# Patient Record
Sex: Female | Born: 1955
Health system: Southern US, Community
[De-identification: ages and names within clinical notes are randomized; demographics above are authoritative.]

## PROBLEM LIST (undated history)

## (undated) DIAGNOSIS — E785 Hyperlipidemia, unspecified: Secondary | ICD-10-CM

## (undated) DIAGNOSIS — Z9109 Other allergy status, other than to drugs and biological substances: Secondary | ICD-10-CM

## (undated) DIAGNOSIS — Z923 Personal history of irradiation: Secondary | ICD-10-CM

## (undated) DIAGNOSIS — M1990 Unspecified osteoarthritis, unspecified site: Secondary | ICD-10-CM

## (undated) DIAGNOSIS — E669 Obesity, unspecified: Secondary | ICD-10-CM

## (undated) DIAGNOSIS — M4316 Spondylolisthesis, lumbar region: Secondary | ICD-10-CM

## (undated) DIAGNOSIS — I499 Cardiac arrhythmia, unspecified: Secondary | ICD-10-CM

## (undated) DIAGNOSIS — R002 Palpitations: Secondary | ICD-10-CM

## (undated) DIAGNOSIS — Z87891 Personal history of nicotine dependence: Secondary | ICD-10-CM

## (undated) DIAGNOSIS — Z853 Personal history of malignant neoplasm of breast: Secondary | ICD-10-CM

## (undated) DIAGNOSIS — T7840XA Allergy, unspecified, initial encounter: Secondary | ICD-10-CM

## (undated) DIAGNOSIS — K2970 Gastritis, unspecified, without bleeding: Secondary | ICD-10-CM

## (undated) DIAGNOSIS — F419 Anxiety disorder, unspecified: Secondary | ICD-10-CM

## (undated) DIAGNOSIS — I10 Essential (primary) hypertension: Secondary | ICD-10-CM

## (undated) HISTORY — PX: KNEE ARTHROSCOPY: SUR90

## (undated) HISTORY — PX: COLONOSCOPY: SHX174

## (undated) HISTORY — DX: Hyperlipidemia, unspecified: E78.5

## (undated) HISTORY — DX: Personal history of nicotine dependence: Z87.891

## (undated) HISTORY — DX: Allergy, unspecified, initial encounter: T78.40XA

## (undated) HISTORY — DX: Spondylolisthesis, lumbar region: M43.16

## (undated) HISTORY — DX: Obesity, unspecified: E66.9

## (undated) HISTORY — DX: Palpitations: R00.2

## (undated) HISTORY — DX: Unspecified osteoarthritis, unspecified site: M19.90

## (undated) HISTORY — DX: Personal history of malignant neoplasm of breast: Z85.3

## (undated) HISTORY — DX: Other allergy status, other than to drugs and biological substances: Z91.09

---

## 2017-12-30 DIAGNOSIS — F32A Depression, unspecified: Secondary | ICD-10-CM | POA: Insufficient documentation

## 2017-12-30 DIAGNOSIS — I10 Essential (primary) hypertension: Secondary | ICD-10-CM | POA: Insufficient documentation

## 2017-12-30 DIAGNOSIS — M1990 Unspecified osteoarthritis, unspecified site: Secondary | ICD-10-CM | POA: Insufficient documentation

## 2019-08-13 DIAGNOSIS — R590 Localized enlarged lymph nodes: Secondary | ICD-10-CM | POA: Diagnosis not present

## 2019-09-18 DIAGNOSIS — I10 Essential (primary) hypertension: Secondary | ICD-10-CM | POA: Diagnosis not present

## 2019-09-18 DIAGNOSIS — F064 Anxiety disorder due to known physiological condition: Secondary | ICD-10-CM | POA: Diagnosis not present

## 2019-09-18 DIAGNOSIS — Z20828 Contact with and (suspected) exposure to other viral communicable diseases: Secondary | ICD-10-CM | POA: Diagnosis not present

## 2019-09-18 DIAGNOSIS — J9801 Acute bronchospasm: Secondary | ICD-10-CM | POA: Diagnosis not present

## 2019-09-18 DIAGNOSIS — E782 Mixed hyperlipidemia: Secondary | ICD-10-CM | POA: Diagnosis not present

## 2019-10-02 ENCOUNTER — Ambulatory Visit
Admission: RE | Admit: 2019-10-02 | Discharge: 2019-10-02 | Disposition: A | Discharge status: Home / Self Care | Payer: BLUE CROSS/BLUE SHIELD | Attending: Family Medicine | Admitting: Family Medicine

## 2019-10-02 ENCOUNTER — Other Ambulatory Visit: Payer: Self-pay | Admitting: Family Medicine

## 2019-10-02 ENCOUNTER — Ambulatory Visit
Admission: RE | Admit: 2019-10-02 | Discharge: 2019-10-02 | Disposition: A | Discharge status: Home / Self Care | Payer: BLUE CROSS/BLUE SHIELD | Source: Ambulatory Visit | Attending: Family Medicine | Admitting: Family Medicine

## 2019-10-02 ENCOUNTER — Other Ambulatory Visit: Payer: Self-pay

## 2019-10-02 DIAGNOSIS — R0602 Shortness of breath: Secondary | ICD-10-CM | POA: Diagnosis not present

## 2019-10-02 DIAGNOSIS — R059 Cough, unspecified: Secondary | ICD-10-CM

## 2019-10-02 DIAGNOSIS — R05 Cough: Secondary | ICD-10-CM

## 2019-10-17 DIAGNOSIS — J9801 Acute bronchospasm: Secondary | ICD-10-CM | POA: Diagnosis not present

## 2019-10-17 DIAGNOSIS — M130 Polyarthritis, unspecified: Secondary | ICD-10-CM | POA: Diagnosis not present

## 2019-10-17 DIAGNOSIS — I10 Essential (primary) hypertension: Secondary | ICD-10-CM | POA: Diagnosis not present

## 2019-10-17 DIAGNOSIS — E782 Mixed hyperlipidemia: Secondary | ICD-10-CM | POA: Diagnosis not present

## 2019-11-13 DIAGNOSIS — M797 Fibromyalgia: Secondary | ICD-10-CM | POA: Diagnosis not present

## 2019-11-13 DIAGNOSIS — I10 Essential (primary) hypertension: Secondary | ICD-10-CM | POA: Diagnosis not present

## 2019-11-13 DIAGNOSIS — M1990 Unspecified osteoarthritis, unspecified site: Secondary | ICD-10-CM | POA: Diagnosis not present

## 2019-11-13 DIAGNOSIS — M25569 Pain in unspecified knee: Secondary | ICD-10-CM | POA: Diagnosis not present

## 2019-11-16 DIAGNOSIS — I10 Essential (primary) hypertension: Secondary | ICD-10-CM | POA: Diagnosis not present

## 2019-12-18 DIAGNOSIS — E782 Mixed hyperlipidemia: Secondary | ICD-10-CM | POA: Diagnosis not present

## 2019-12-18 DIAGNOSIS — F064 Anxiety disorder due to known physiological condition: Secondary | ICD-10-CM | POA: Diagnosis not present

## 2019-12-18 DIAGNOSIS — I10 Essential (primary) hypertension: Secondary | ICD-10-CM | POA: Diagnosis not present

## 2019-12-18 DIAGNOSIS — M130 Polyarthritis, unspecified: Secondary | ICD-10-CM | POA: Diagnosis not present

## 2020-01-15 DIAGNOSIS — I10 Essential (primary) hypertension: Secondary | ICD-10-CM | POA: Diagnosis not present

## 2020-01-15 DIAGNOSIS — M130 Polyarthritis, unspecified: Secondary | ICD-10-CM | POA: Diagnosis not present

## 2020-01-15 DIAGNOSIS — E782 Mixed hyperlipidemia: Secondary | ICD-10-CM | POA: Diagnosis not present

## 2020-01-15 DIAGNOSIS — F064 Anxiety disorder due to known physiological condition: Secondary | ICD-10-CM | POA: Diagnosis not present

## 2020-01-31 DIAGNOSIS — M545 Low back pain: Secondary | ICD-10-CM | POA: Diagnosis not present

## 2020-01-31 DIAGNOSIS — M25561 Pain in right knee: Secondary | ICD-10-CM | POA: Diagnosis not present

## 2020-01-31 DIAGNOSIS — M25562 Pain in left knee: Secondary | ICD-10-CM | POA: Diagnosis not present

## 2020-02-01 DIAGNOSIS — I10 Essential (primary) hypertension: Secondary | ICD-10-CM | POA: Diagnosis not present

## 2020-03-05 DIAGNOSIS — E782 Mixed hyperlipidemia: Secondary | ICD-10-CM | POA: Diagnosis not present

## 2020-03-05 DIAGNOSIS — F064 Anxiety disorder due to known physiological condition: Secondary | ICD-10-CM | POA: Diagnosis not present

## 2020-03-05 DIAGNOSIS — I10 Essential (primary) hypertension: Secondary | ICD-10-CM | POA: Diagnosis not present

## 2020-03-05 DIAGNOSIS — F332 Major depressive disorder, recurrent severe without psychotic features: Secondary | ICD-10-CM | POA: Diagnosis not present

## 2020-03-25 DIAGNOSIS — M25562 Pain in left knee: Secondary | ICD-10-CM | POA: Diagnosis not present

## 2020-03-25 DIAGNOSIS — M25561 Pain in right knee: Secondary | ICD-10-CM | POA: Diagnosis not present

## 2020-03-25 DIAGNOSIS — M545 Low back pain: Secondary | ICD-10-CM | POA: Diagnosis not present

## 2020-04-04 DIAGNOSIS — H43812 Vitreous degeneration, left eye: Secondary | ICD-10-CM | POA: Diagnosis not present

## 2020-04-04 DIAGNOSIS — H2513 Age-related nuclear cataract, bilateral: Secondary | ICD-10-CM | POA: Diagnosis not present

## 2020-04-09 DIAGNOSIS — E782 Mixed hyperlipidemia: Secondary | ICD-10-CM | POA: Diagnosis not present

## 2020-04-09 DIAGNOSIS — I10 Essential (primary) hypertension: Secondary | ICD-10-CM | POA: Diagnosis not present

## 2020-04-09 DIAGNOSIS — M1030 Gout due to renal impairment, unspecified site: Secondary | ICD-10-CM | POA: Diagnosis not present

## 2020-04-09 DIAGNOSIS — F332 Major depressive disorder, recurrent severe without psychotic features: Secondary | ICD-10-CM | POA: Diagnosis not present

## 2020-04-24 DIAGNOSIS — M170 Bilateral primary osteoarthritis of knee: Secondary | ICD-10-CM | POA: Diagnosis not present

## 2020-05-03 DIAGNOSIS — M170 Bilateral primary osteoarthritis of knee: Secondary | ICD-10-CM | POA: Diagnosis not present

## 2020-05-20 DIAGNOSIS — M170 Bilateral primary osteoarthritis of knee: Secondary | ICD-10-CM | POA: Diagnosis not present

## 2020-06-10 DIAGNOSIS — M130 Polyarthritis, unspecified: Secondary | ICD-10-CM | POA: Diagnosis not present

## 2020-06-10 DIAGNOSIS — I10 Essential (primary) hypertension: Secondary | ICD-10-CM | POA: Diagnosis not present

## 2020-06-10 DIAGNOSIS — F064 Anxiety disorder due to known physiological condition: Secondary | ICD-10-CM | POA: Diagnosis not present

## 2020-06-10 DIAGNOSIS — E782 Mixed hyperlipidemia: Secondary | ICD-10-CM | POA: Diagnosis not present

## 2020-06-21 DIAGNOSIS — M25551 Pain in right hip: Secondary | ICD-10-CM | POA: Diagnosis not present

## 2020-06-21 DIAGNOSIS — M25552 Pain in left hip: Secondary | ICD-10-CM | POA: Diagnosis not present

## 2020-07-08 DIAGNOSIS — M1612 Unilateral primary osteoarthritis, left hip: Secondary | ICD-10-CM | POA: Diagnosis not present

## 2021-04-16 ENCOUNTER — Other Ambulatory Visit: Payer: Self-pay | Admitting: Family Medicine

## 2021-04-16 DIAGNOSIS — Z1231 Encounter for screening mammogram for malignant neoplasm of breast: Secondary | ICD-10-CM

## 2021-05-29 IMAGING — CR DG CHEST 2V
1 series · 2 of 2 positions shown · non-contrast
Comparison: None.

CLINICAL DATA: Cough, wheezing, shortness of breath, negative COVID
test

EXAM:
CHEST - 2 VIEW

[Series 1: dg chest 2 view · 0.14mm/px · 2 of 2 slices shown]
[im 1/2]
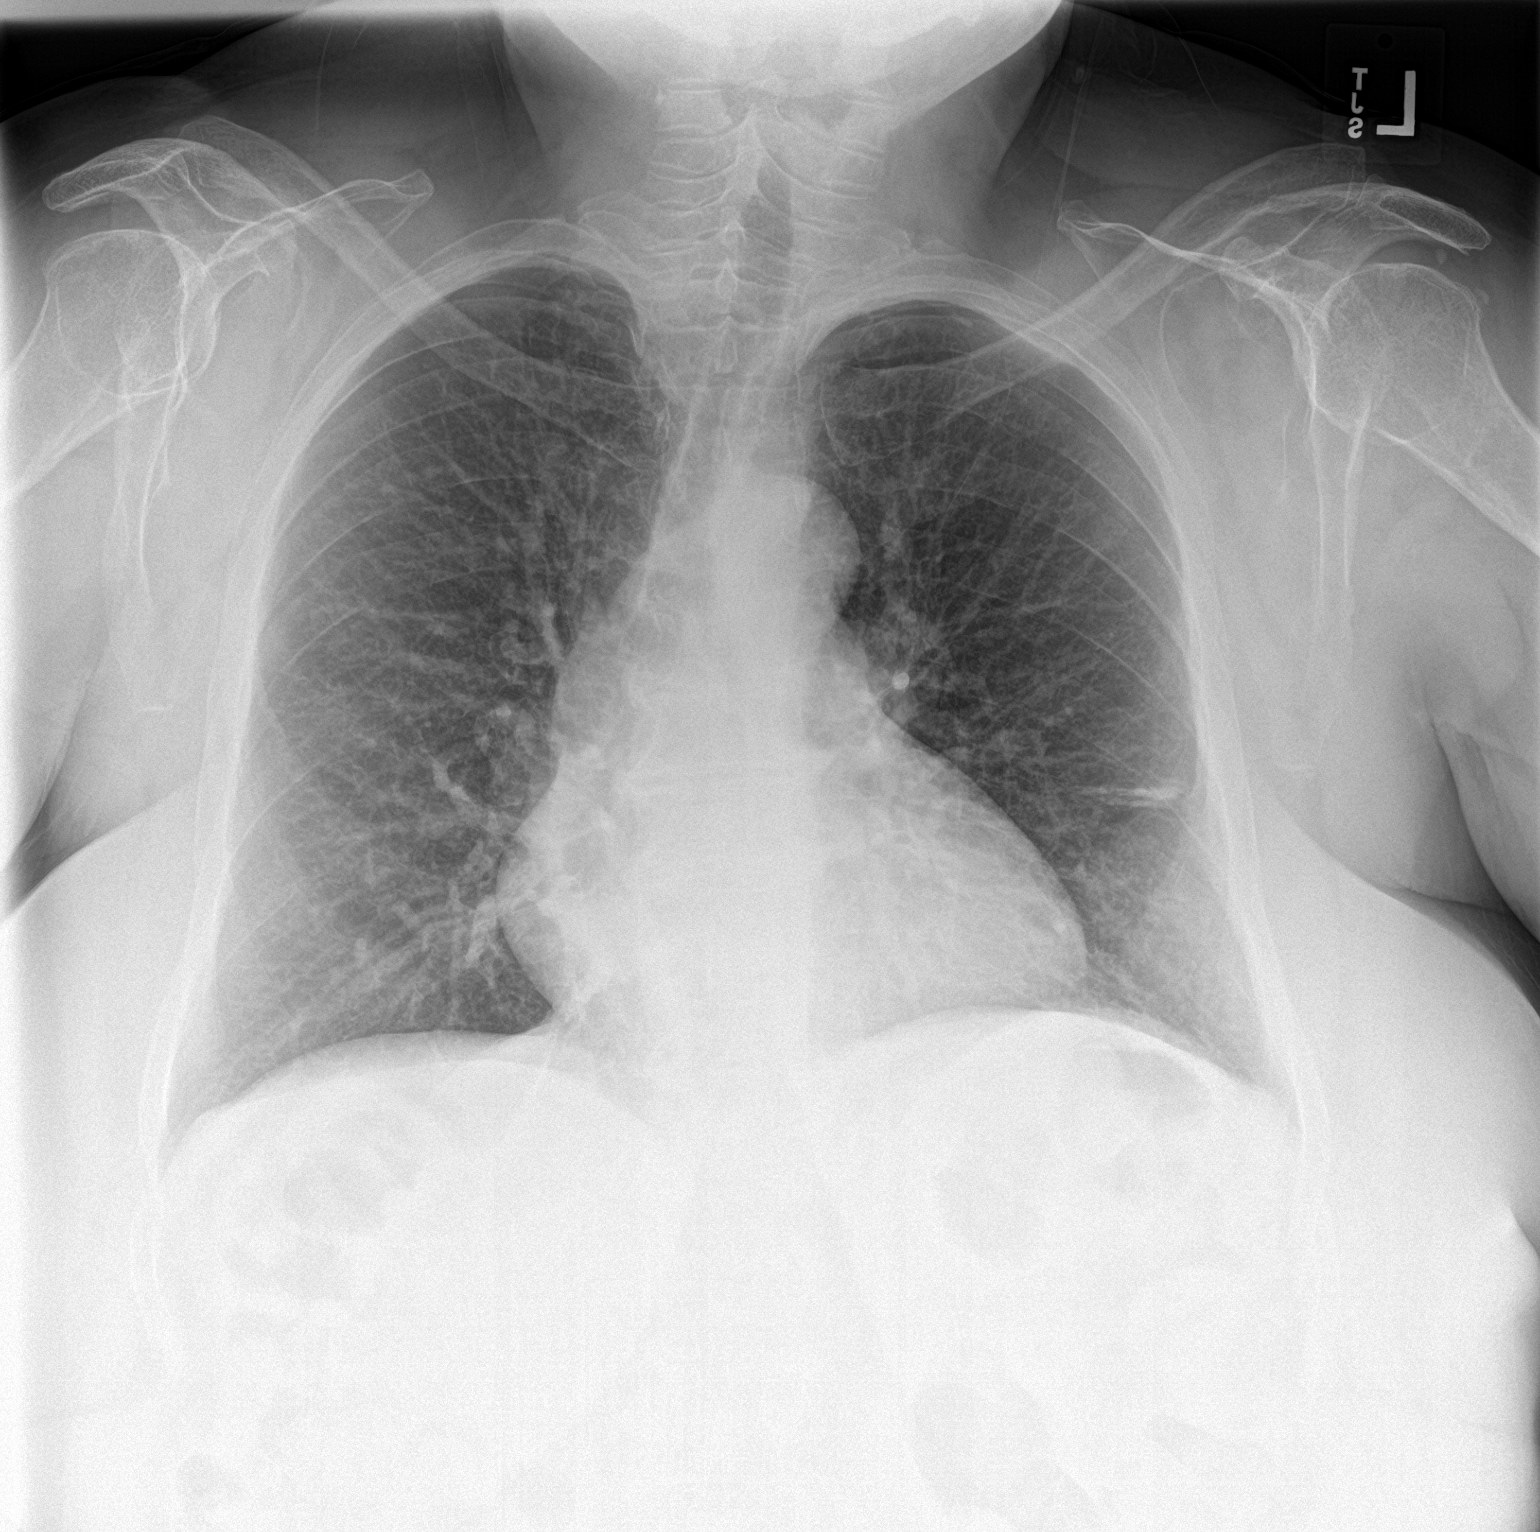
[im 2/2]
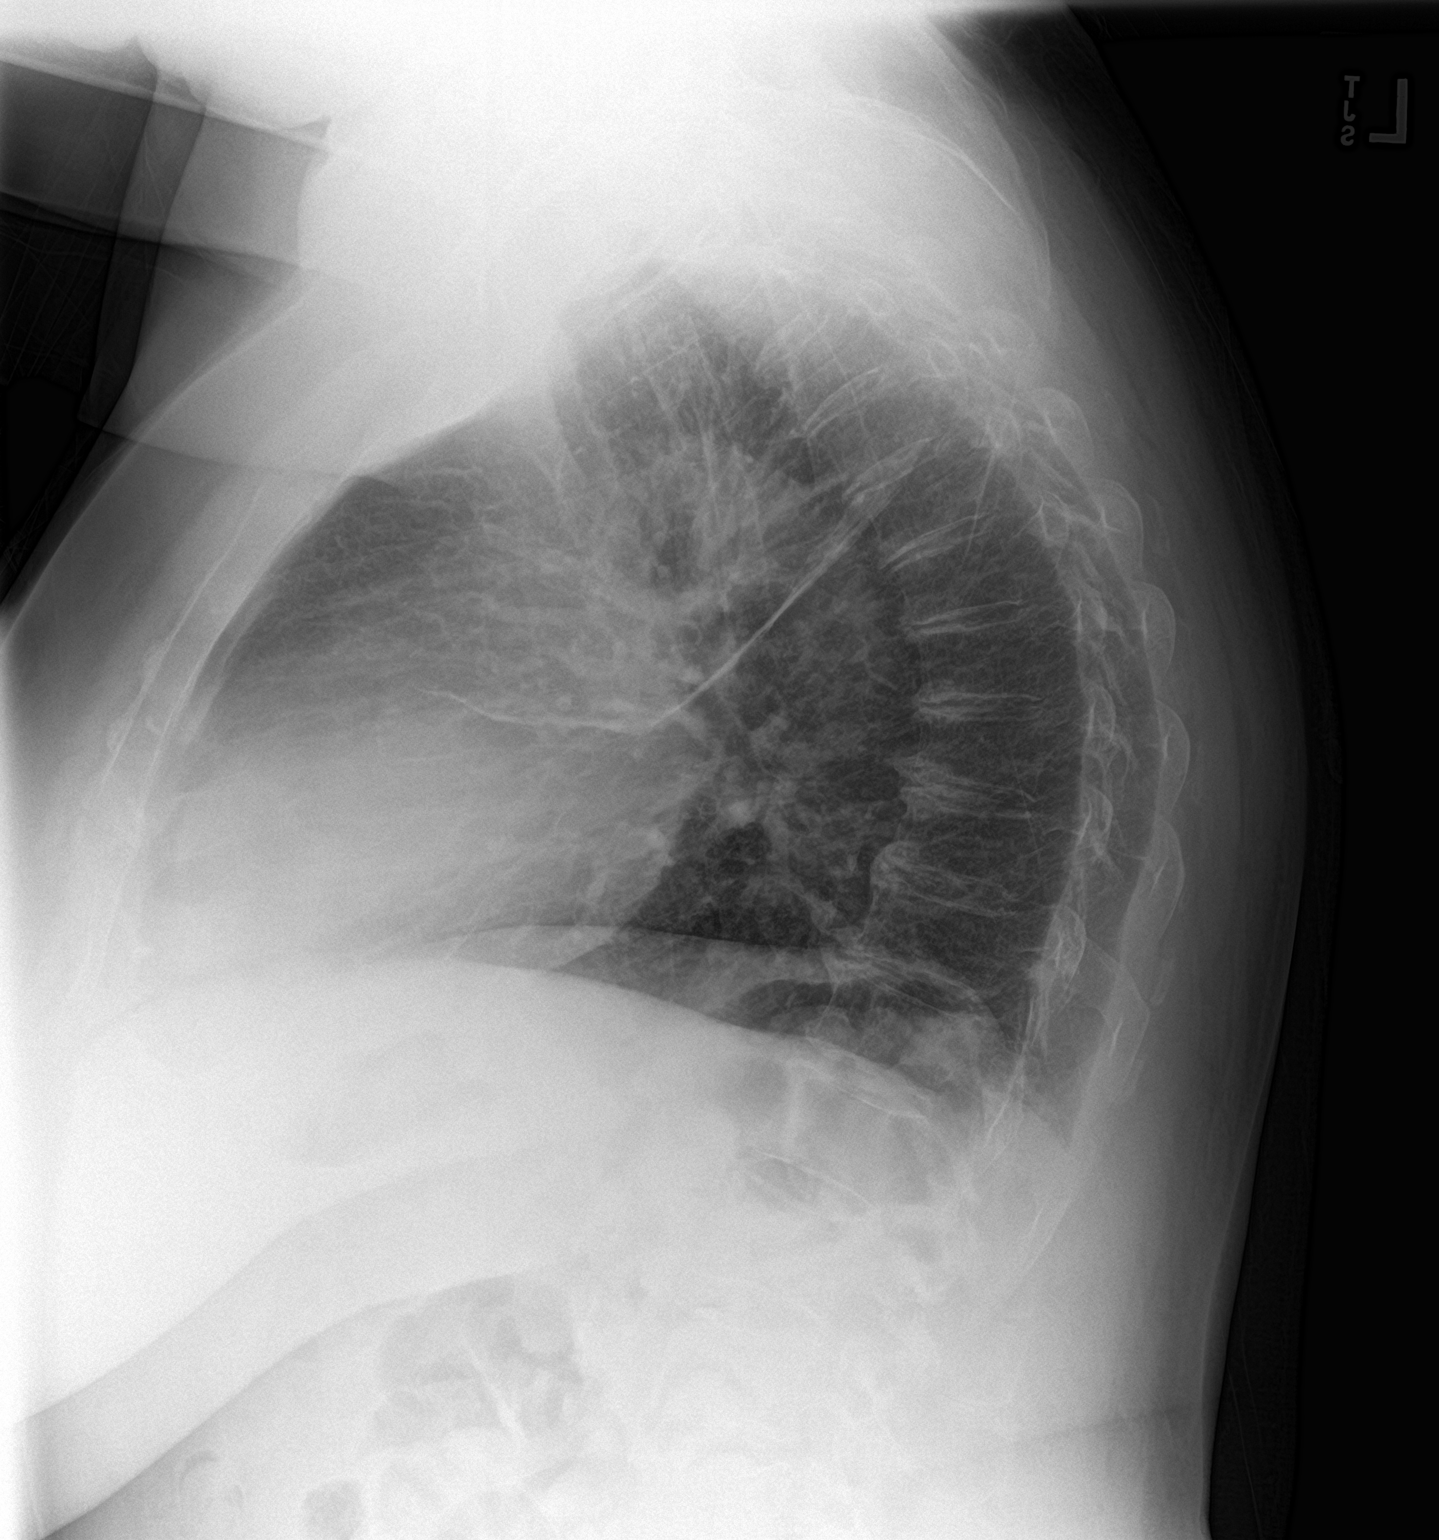

[2 of 2 positions shown; findings below may reference images not displayed]

FINDINGS: Mild cardiomegaly. Pulmonary vascular prominence and mild, diffuse
interstitial pulmonary opacity. Bandlike scarring or atelectasis of
the left midlung. Disc degenerative disease of the thoracic spine.
IMPRESSION: Mild cardiomegaly with pulmonary vascular prominence and mild,
diffuse interstitial pulmonary opacity, findings consistent with
mild pulmonary edema. There is no focal acute airspace opacity.

## 2021-06-03 ENCOUNTER — Ambulatory Visit: Payer: BLUE CROSS/BLUE SHIELD

## 2021-06-17 ENCOUNTER — Other Ambulatory Visit (HOSPITAL_COMMUNITY): Payer: Self-pay | Admitting: Family Medicine

## 2021-06-17 DIAGNOSIS — I739 Peripheral vascular disease, unspecified: Secondary | ICD-10-CM

## 2021-06-20 ENCOUNTER — Ambulatory Visit (HOSPITAL_COMMUNITY)
Admission: RE | Admit: 2021-06-20 | Discharge: 2021-06-20 | Disposition: A | Discharge status: Home / Self Care | Payer: Medicare HMO | Source: Ambulatory Visit | Attending: Family Medicine | Admitting: Family Medicine

## 2021-06-20 ENCOUNTER — Other Ambulatory Visit: Payer: Self-pay

## 2021-06-20 DIAGNOSIS — I739 Peripheral vascular disease, unspecified: Secondary | ICD-10-CM

## 2021-06-20 NOTE — Progress Notes (Signed)
ABI has been completed.   Preliminary results in CV Proc.   Kaitlyn Ramsey 06/20/2021 9:07 AM

## 2021-06-27 ENCOUNTER — Ambulatory Visit: Payer: Self-pay

## 2021-07-30 ENCOUNTER — Ambulatory Visit: Payer: Self-pay

## 2021-08-01 ENCOUNTER — Ambulatory Visit: Payer: Self-pay

## 2021-09-19 ENCOUNTER — Ambulatory Visit
Admission: RE | Admit: 2021-09-19 | Discharge: 2021-09-19 | Disposition: A | Discharge status: Home / Self Care | Payer: Medicare HMO | Source: Ambulatory Visit | Attending: Family Medicine | Admitting: Family Medicine

## 2021-09-19 DIAGNOSIS — Z1231 Encounter for screening mammogram for malignant neoplasm of breast: Secondary | ICD-10-CM

## 2022-11-11 ENCOUNTER — Other Ambulatory Visit: Payer: Self-pay | Admitting: Family Medicine

## 2022-11-11 DIAGNOSIS — Z1231 Encounter for screening mammogram for malignant neoplasm of breast: Secondary | ICD-10-CM

## 2022-12-30 ENCOUNTER — Ambulatory Visit
Admission: RE | Admit: 2022-12-30 | Discharge: 2022-12-30 | Disposition: A | Discharge status: Home / Self Care | Payer: Medicare HMO | Source: Ambulatory Visit | Attending: Family Medicine | Admitting: Family Medicine

## 2022-12-30 DIAGNOSIS — Z1231 Encounter for screening mammogram for malignant neoplasm of breast: Secondary | ICD-10-CM

## 2023-01-01 ENCOUNTER — Other Ambulatory Visit: Payer: Self-pay | Admitting: Family Medicine

## 2023-01-01 DIAGNOSIS — R928 Other abnormal and inconclusive findings on diagnostic imaging of breast: Secondary | ICD-10-CM

## 2023-01-09 ENCOUNTER — Other Ambulatory Visit: Payer: Self-pay | Admitting: Family Medicine

## 2023-01-09 ENCOUNTER — Ambulatory Visit
Admission: RE | Admit: 2023-01-09 | Discharge: 2023-01-09 | Disposition: A | Discharge status: Home / Self Care | Payer: Medicare HMO | Source: Ambulatory Visit | Attending: Family Medicine | Admitting: Family Medicine

## 2023-01-09 DIAGNOSIS — N6320 Unspecified lump in the left breast, unspecified quadrant: Secondary | ICD-10-CM

## 2023-01-09 DIAGNOSIS — R599 Enlarged lymph nodes, unspecified: Secondary | ICD-10-CM

## 2023-01-09 DIAGNOSIS — R928 Other abnormal and inconclusive findings on diagnostic imaging of breast: Secondary | ICD-10-CM

## 2023-01-15 ENCOUNTER — Ambulatory Visit
Admission: RE | Admit: 2023-01-15 | Discharge: 2023-01-15 | Disposition: A | Discharge status: Home / Self Care | Payer: Medicare HMO | Source: Ambulatory Visit | Attending: Family Medicine | Admitting: Family Medicine

## 2023-01-15 DIAGNOSIS — R599 Enlarged lymph nodes, unspecified: Secondary | ICD-10-CM

## 2023-01-15 DIAGNOSIS — N6320 Unspecified lump in the left breast, unspecified quadrant: Secondary | ICD-10-CM

## 2023-01-15 DIAGNOSIS — R928 Other abnormal and inconclusive findings on diagnostic imaging of breast: Secondary | ICD-10-CM

## 2023-01-15 HISTORY — PX: BREAST BIOPSY: SHX20

## 2023-01-19 ENCOUNTER — Telehealth: Payer: Self-pay | Admitting: Hematology and Oncology

## 2023-01-19 NOTE — Telephone Encounter (Signed)
Spoke to patient to confirm upcoming morning [facility] clinic appointment  on 5/29, paperwork will be sent via mail.   Gave location and time, also informed patient that the surgeon's office would be calling as well to get information from them similar to the packet that they will be receiving so make sure to do both.  Reminded patient that all providers will be coming to the clinic to see them HERE and if they had any questions to not hesitate to reach back out to myself or their navigators. 

## 2023-01-19 NOTE — Telephone Encounter (Signed)
Patient called to see if she could get some meds for anxiety or to help her sleep, she has been beside herself since getting the news that she has cancer, I told her I would ask the team, but technically she hasn't been seen by our providers just yet. I told her to come to her appointment get the information it may calm her down and if not then she could ask the provider then

## 2023-01-26 ENCOUNTER — Encounter: Payer: Self-pay | Admitting: *Deleted

## 2023-01-26 ENCOUNTER — Other Ambulatory Visit: Payer: Self-pay | Admitting: *Deleted

## 2023-01-26 DIAGNOSIS — D0512 Intraductal carcinoma in situ of left breast: Secondary | ICD-10-CM | POA: Insufficient documentation

## 2023-01-27 ENCOUNTER — Encounter: Payer: Self-pay | Admitting: *Deleted

## 2023-01-27 ENCOUNTER — Inpatient Hospital Stay: Payer: Medicare HMO | Admitting: Licensed Clinical Social Worker

## 2023-01-27 ENCOUNTER — Inpatient Hospital Stay: Payer: Medicare HMO | Attending: Hematology and Oncology | Admitting: Hematology and Oncology

## 2023-01-27 ENCOUNTER — Ambulatory Visit
Admission: RE | Admit: 2023-01-27 | Discharge: 2023-01-27 | Disposition: A | Discharge status: Home / Self Care | Payer: Medicare HMO | Source: Ambulatory Visit | Attending: Radiation Oncology | Admitting: Radiation Oncology

## 2023-01-27 ENCOUNTER — Ambulatory Visit: Payer: Medicare HMO | Admitting: Physical Therapy

## 2023-01-27 ENCOUNTER — Telehealth: Payer: Self-pay | Admitting: Genetic Counselor

## 2023-01-27 ENCOUNTER — Inpatient Hospital Stay: Payer: Medicare HMO

## 2023-01-27 ENCOUNTER — Ambulatory Visit: Payer: Self-pay | Admitting: Surgery

## 2023-01-27 VITALS — BP 138/59 | HR 76 | Temp 98.7°F | Resp 18 | Wt 207.6 lb

## 2023-01-27 DIAGNOSIS — D0512 Intraductal carcinoma in situ of left breast: Secondary | ICD-10-CM | POA: Diagnosis present

## 2023-01-27 DIAGNOSIS — Z170 Estrogen receptor positive status [ER+]: Secondary | ICD-10-CM

## 2023-01-27 LAB — GENETIC SCREENING ORDER

## 2023-01-27 LAB — CMP (CANCER CENTER ONLY)
ALT: 18 U/L (ref 0–44)
AST: 22 U/L (ref 15–41)
Albumin: 4.6 g/dL (ref 3.5–5.0)
Alkaline Phosphatase: 75 U/L (ref 38–126)
Anion gap: 6 (ref 5–15)
BUN: 10 mg/dL (ref 8–23)
CO2: 32 mmol/L (ref 22–32)
Calcium: 9.9 mg/dL (ref 8.9–10.3)
Chloride: 100 mmol/L (ref 98–111)
Creatinine: 0.74 mg/dL (ref 0.44–1.00)
GFR, Estimated: >60 mL/min (ref >60)
Glucose, Bld: 102 mg/dL — ABNORMAL HIGH (ref 70–99)
Potassium: 3.8 mmol/L (ref 3.5–5.1)
Sodium: 138 mmol/L (ref 135–145)
Total Bilirubin: 1 mg/dL (ref 0.3–1.2)
Total Protein: 8.1 g/dL (ref 6.5–8.1)

## 2023-01-27 LAB — CBC WITH DIFFERENTIAL (CANCER CENTER ONLY)
Abs Immature Granulocytes: 0.01 10*3/uL (ref 0.00–0.07)
Basophils Absolute: 0.1 10*3/uL (ref 0.0–0.1)
Basophils Relative: 1 %
Eosinophils Absolute: 0.1 10*3/uL (ref 0.0–0.5)
Eosinophils Relative: 2 %
HCT: 39.5 % (ref 36.0–46.0)
Hemoglobin: 14.5 g/dL (ref 12.0–15.0)
Immature Granulocytes: 0 %
Lymphocytes Relative: 34 %
Lymphs Abs: 2.1 10*3/uL (ref 0.7–4.0)
MCH: 29.9 pg (ref 26.0–34.0)
MCHC: 36.7 g/dL — ABNORMAL HIGH (ref 30.0–36.0)
MCV: 81.4 fL (ref 80.0–100.0)
Monocytes Absolute: 0.5 10*3/uL (ref 0.1–1.0)
Monocytes Relative: 7 %
Neutro Abs: 3.5 10*3/uL (ref 1.7–7.7)
Neutrophils Relative %: 56 %
Platelet Count: 278 10*3/uL (ref 150–400)
RBC: 4.85 MIL/uL (ref 3.87–5.11)
RDW: 12.5 % (ref 11.5–15.5)
WBC Count: 6.3 10*3/uL (ref 4.0–10.5)
nRBC: 0 % (ref 0.0–0.2)

## 2023-01-27 MED ORDER — ALPRAZOLAM 0.5 MG PO TABS: 0.5000 mg | ORAL_TABLET | Freq: Every day | ORAL | 0 refills | Status: DC | PRN

## 2023-01-27 NOTE — Assessment & Plan Note (Signed)
This is a very pleasant 67 yr old female patient with new diagnosis of left breast DCIS, ER PR positive referred to breast MDC for recommendations. We have discussed the following findings about DCIS.  Pathology review: I discussed with the patient the difference between DCIS and invasive breast cancer. It is considered a precancerous lesion. DCIS is classified as a Stage 0 breast cancer. It is generally detected through mammograms as calcifications. We discussed the significance of grades and its impact on prognosis. We also discussed the importance of ER and PR receptors and their implications to adjuvant treatment options. Prognosis of DCIS dependence on grade and degree of comedo necrosis. It is anticipated that if not treated, 20-30% of DCIS can develop into invasive breast cancer.  Recommendation: 1. Breast conserving surgery 2. Followed by adjuvant radiation therapy 3. Followed by antiestrogen therapy with tamoxifen/aromatase inhibitors based on menopausal status 5 years  Tamoxifen counseling: We discussed the risks and benefits of tamoxifen. These include but not limited to insomnia, hot flashes, mood changes, vaginal dryness, and weight gain. Although rare, serious side effects including endometrial cancer, risk of blood clots were also discussed. We strongly believe that the benefits far outweigh the risks. Patient understands these risks and consented to starting treatment. Planned treatment duration is 5 years.  Aromatase inhibitors counseling: We have discussed the mechanism of action of aromatase inhibitors today.  We have discussed adverse effects including but not limited to menopausal symptoms, increased risk of osteoporosis and fractures, cardiovascular events, arthralgias and myalgias.  We do believe that the benefits far outweigh the risks.  Plan treatment duration of 5 years.

## 2023-01-27 NOTE — H&P (Signed)
Subjective   Chief Complaint: Breast Cancer   Breast [facility]DC 01/27/23 [place]/ [place]  History of Present Illness: Kaitlyn Ramsey is a 67 y.o. female who is seen today as an office consultation at the request of Dr. Al Pimple for evaluation of Breast Cancer .    This is a 67 year old female who recently underwent screening mammogram.  This shows some asymmetry in the left breast.  She underwent diagnostic mammogram and ultrasound.  This showed a 1.0 x 1.0 x 0.4 cm longitudinal mass located in the left breast at 2:00, 2 cm from the nipple.  She underwent biopsy of this area that revealed ductal carcinoma in situ grade 2, ER/PR positive.  The patient has no family history of breast cancer.  She presents today for evaluation.  She is accompanied by her daughter who is a Armed forces training and education officer for [employer] at [employer]. Review of Systems: A complete review of systems was obtained from the patient.  I have reviewed this information and discussed as appropriate with the patient.  See HPI as well for other ROS.  Review of Systems  Constitutional: Negative.   HENT: Negative.    Eyes: Negative.   Respiratory: Negative.    Cardiovascular: Negative.   Gastrointestinal: Negative.   Genitourinary: Negative.   Musculoskeletal:  Positive for back pain, joint pain and myalgias.  Skin: Negative.   Neurological: Negative.   Endo/Heme/Allergies: Negative.   Psychiatric/Behavioral: Negative.        Medical History: Past Medical History:  Diagnosis Date   Anxiety    Arthritis    Hyperlipidemia    Hypertension     Patient Active Problem List  Diagnosis   Ductal carcinoma in situ (DCIS) of left breast   Otalgia, left ear   Sensorineural hearing loss (SNHL) of both ears   Tinnitus of both ears   Herpes zoster of neck   Palpitations   Heartburn   Nervously anxious    Past Surgical History:  Procedure Laterality Date   .Knee surgery  N/A 2009     Allergies  Allergen Reactions    Penicillins Other (See Comments) and Rash    Current Outpatient Medications on File Prior to Visit  Medication Sig Dispense Refill   amLODIPine (NORVASC) 10 MG tablet Take 10 mg by mouth once daily     atorvastatin (LIPITOR) 20 MG tablet Take 20 mg by mouth once daily     citalopram (CELEXA) 10 MG tablet Take 10-20 mg by mouth as directed TAKE 1 TABLET BY MOUTH IN THE MORNING FOR 10 DAYS, THEN TAKE 2 TABLETS BY MOUTH EVERY AM     gabapentin (NEURONTIN) 600 MG tablet Take 600 mg by mouth 3 (three) times daily     metoprolol tartrate (LOPRESSOR) 25 MG tablet Take 25 mg by mouth 2 (two) times daily     oxyCODONE-acetaminophen (PERCOCET) 10-325 mg tablet Take 1 tablet by mouth every 6 (six) hours as needed for Pain     OZEMPIC 2 mg/dose (8 mg/3 mL) pen injector Inject 2 mg subcutaneously once a week     traMADoL (ULTRAM) 100 mg tablet Take 100 mg by mouth every 8 (eight) hours as needed     valsartan-hydroCHLOROthiazide (DIOVAN-HCT) 160-25 mg tablet Take 1 tablet by mouth every morning     No current facility-administered medications on file prior to visit.    Family History  Family history unknown: Yes     Social History   Tobacco Use  Smoking Status Former   Types: Cigarettes  Smokeless Tobacco Never     Social History   Socioeconomic History   Marital status: Single  Tobacco Use   Smoking status: Former    Types: Cigarettes   Smokeless tobacco: Never  Vaping Use   Vaping status: Never Used  Substance and Sexual Activity   Alcohol use: Never   Drug use: Never   Social Determinants of Health    Received from [employer]   Social Network    Objective:     Physical Exam   Constitutional:  WDWN in NAD, conversant, no obvious deformities; lying in bed comfortably Eyes:  Pupils equal, round; sclera anicteric; moist conjunctiva; no lid lag HENT:  Oral mucosa moist; good dentition  Neck:  No masses palpated, trachea midline; no thyromegaly Lungs:  CTA bilaterally;  normal respiratory effort Breasts:  symmetric, no nipple changes; no palpable masses or lymphadenopathy on either side CV:  Regular rate and rhythm; no murmurs; extremities well-perfused with no edema Abd:  +bowel sounds, soft, non-tender, no palpable organomegaly; no palpable hernias Musc: Normal gait; no apparent clubbing or cyanosis in extremities Lymphatic:  No palpable cervical or axillary lymphadenopathy Skin:  Warm, dry; no sign of jaundice Psychiatric - alert and oriented x 4; calm mood and affect   Labs, Imaging and Diagnostic Testing:  Diagnosis 1. Breast, left, needle core biopsy, 2 o'clock, 2 cmfn, venus clip DUCTAL CARCINOMA IN SITU, INTERMEDIATE NUCLEAR GRADE, CRIBRIFORM TYPE WITHOUT NECROSIS INVOLVING A FRAGMENTED PAPILLARY LESION NEGATIVE FOR INVASIVE CARCINOMA NEGATIVE FOR MICROCALCIFICATIONS 2. Lymph node, needle/core biopsy, left axillary, hydromark spiral clip COMPATIBLE WITH A BENIGN REACTIVE LYMPH NODE  PROGNOSTIC INDICATORS Results: IMMUNOHISTOCHEMICAL AND MORPHOMETRIC ANALYSIS PERFORMED MANUALLY Estrogen Receptor: 100%, POSITIVE, STRONG STAINING INTENSITY Progesterone Receptor: 95%, POSITIVE, STRONG STAINING INTENSITY REFERENCE RANGE ESTROGEN RECEPTOR NEGATIVE 0% POSITIVE =>1% REFERENCE RANGE PROGESTERONE RECEPTOR NEGATIVE 0% POSITIVE =>1% All controls stained appropriately Marlena Clipper MD Pathologist, Electronic Signature ( Signed 01/19/2023)  CLINICAL DATA:  Screening recall for possible left breast asymmetry.   EXAM: DIGITAL DIAGNOSTIC UNILATERAL LEFT MAMMOGRAM WITH TOMOSYNTHESIS; ULTRASOUND LEFT BREAST LIMITED   TECHNIQUE: Left digital diagnostic mammography and breast tomosynthesis was performed.; Targeted ultrasound examination of the left breast was performed.   COMPARISON:  Previous exam(s).   ACR Breast Density Category b: There are scattered areas of fibroglandular density.   FINDINGS: Additional tomograms were performed of the  left breast. There is oval mass in the periareolar/slightly upper outer left breast measuring 0.9 cm. Dilated ducts are also present in the periareolar left breast.   Targeted ultrasound of the left breast was performed demonstrating a mass with the margin irregularity in the left breast at 2 o'clock 2 cm from nipple measuring 1 x 0.4 x 1 cm. This is associated with an adjacent duct and may represent an intraductal. This is felt to correspond well with the mass seen in the left breast at mammography. Several small lymph nodes are identified in the left axilla with a 1.2 cm lymph node with questionable/borderline cortical thickening of 0.4 cm.   IMPRESSION: 1. Suspicious 1 cm mass in the left breast at 2 o'clock 2 cm from nipple.   2. Lymph node with borderline cortical thickening in the left axilla.   RECOMMENDATION: 1. Recommend ultrasound-guided core biopsy of the mass in the left breast at 2 o'clock 2 cm from nipple.   2. Recommend ultrasound-guided core biopsy of the lymph node in the left axilla.   I have discussed the findings and recommendations with the patient. If applicable,  a reminder letter will be sent to the patient regarding the next appointment.   BI-RADS CATEGORY  4: Suspicious.     Electronically Signed   By: Edwin Cap M.D.   On: 01/09/2023 10:14    Assessment and Plan:  Diagnoses and all orders for this visit:  Ductal carcinoma in situ (DCIS) of left breast    I had a discussion with the patient and her daughter.  I also conferred with the other members of the treatment team.  We discussed her surgical options which include mastectomy versus breast conservation.  The patient has a relatively small area of ductal carcinoma in situ.  She is a good candidate for breast conservation.  We recommend left breast radioactive seed localized lumpectomy. The surgical procedure has been discussed with the patient.  Potential risks, benefits, alternative  treatments, and expected outcomes have been explained.  All of the patient's questions at this time have been answered.  The likelihood of reaching the patient's treatment goal is good.  The patient understand the proposed surgical procedure and wishes to proceed.  Surgery will likely be followed by radiation and antiestrogens.    Johnell Bas Delbert Harness, MD  01/27/2023 2:40 PM

## 2023-01-27 NOTE — H&P (View-Only) (Signed)
 Subjective   Chief Complaint: Breast Cancer   Breast [facility] 01/27/23 [facility]/ [place]  History of Present Illness: Kaitlyn Ramsey is a 67 y.o. female who is seen today as an office consultation at the request of Dr. Iruku for evaluation of Breast Cancer .    This is a 67-year-old female who recently underwent screening mammogram.  This shows some asymmetry in the left breast.  She underwent diagnostic mammogram and ultrasound.  This showed a 1.0 x 1.0 x 0.4 cm longitudinal mass located in the left breast at 2:00, 2 cm from the nipple.  She underwent biopsy of this area that revealed ductal carcinoma in situ grade 2, ER/PR positive.  The patient has no family history of breast cancer.  She presents today for evaluation.  She is accompanied by her daughter who is a referral coordinator for [place] at [place]. Review of Systems: A complete review of systems was obtained from the patient.  I have reviewed this information and discussed as appropriate with the patient.  See HPI as well for other ROS.  Review of Systems  Constitutional: Negative.   HENT: Negative.    Eyes: Negative.   Respiratory: Negative.    Cardiovascular: Negative.   Gastrointestinal: Negative.   Genitourinary: Negative.   Musculoskeletal:  Positive for back pain, joint pain and myalgias.  Skin: Negative.   Neurological: Negative.   Endo/Heme/Allergies: Negative.   Psychiatric/Behavioral: Negative.        Medical History: Past Medical History:  Diagnosis Date   Anxiety    Arthritis    Hyperlipidemia    Hypertension     Patient Active Problem List  Diagnosis   Ductal carcinoma in situ (DCIS) of left breast   Otalgia, left ear   Sensorineural hearing loss (SNHL) of both ears   Tinnitus of both ears   Herpes zoster of neck   Palpitations   Heartburn   Nervously anxious    Past Surgical History:  Procedure Laterality Date   .Knee surgery  N/A 2009     Allergies  Allergen Reactions    Penicillins Other (See Comments) and Rash    Current Outpatient Medications on File Prior to Visit  Medication Sig Dispense Refill   amLODIPine (NORVASC) 10 MG tablet Take 10 mg by mouth once daily     atorvastatin (LIPITOR) 20 MG tablet Take 20 mg by mouth once daily     citalopram (CELEXA) 10 MG tablet Take 10-20 mg by mouth as directed TAKE 1 TABLET BY MOUTH IN THE MORNING FOR 10 DAYS, THEN TAKE 2 TABLETS BY MOUTH EVERY AM     gabapentin (NEURONTIN) 600 MG tablet Take 600 mg by mouth 3 (three) times daily     metoprolol tartrate (LOPRESSOR) 25 MG tablet Take 25 mg by mouth 2 (two) times daily     oxyCODONE-acetaminophen (PERCOCET) 10-325 mg tablet Take 1 tablet by mouth every 6 (six) hours as needed for Pain     OZEMPIC 2 mg/dose (8 mg/3 mL) pen injector Inject 2 mg subcutaneously once a week     traMADoL (ULTRAM) 100 mg tablet Take 100 mg by mouth every 8 (eight) hours as needed     valsartan-hydroCHLOROthiazide (DIOVAN-HCT) 160-25 mg tablet Take 1 tablet by mouth every morning     No current facility-administered medications on file prior to visit.    Family History  Family history unknown: Yes     Social History   Tobacco Use  Smoking Status Former   Types: Cigarettes    Smokeless Tobacco Never     Social History   Socioeconomic History   Marital status: Single  Tobacco Use   Smoking status: Former    Types: Cigarettes   Smokeless tobacco: Never  Vaping Use   Vaping status: Never Used  Substance and Sexual Activity   Alcohol use: Never   Drug use: Never   Social Determinants of Health    Received from [facility]   Social Network    Objective:     Physical Exam   Constitutional:  WDWN in NAD, conversant, no obvious deformities; lying in bed comfortably Eyes:  Pupils equal, round; sclera anicteric; moist conjunctiva; no lid lag HENT:  Oral mucosa moist; good dentition  Neck:  No masses palpated, trachea midline; no thyromegaly Lungs:  CTA bilaterally;  normal respiratory effort Breasts:  symmetric, no nipple changes; no palpable masses or lymphadenopathy on either side CV:  Regular rate and rhythm; no murmurs; extremities well-perfused with no edema Abd:  +bowel sounds, soft, non-tender, no palpable organomegaly; no palpable hernias Musc: Normal gait; no apparent clubbing or cyanosis in extremities Lymphatic:  No palpable cervical or axillary lymphadenopathy Skin:  Warm, dry; no sign of jaundice Psychiatric - alert and oriented x 4; calm mood and affect   Labs, Imaging and Diagnostic Testing:  Diagnosis 1. Breast, left, needle core biopsy, 2 o'clock, 2 cmfn, venus clip DUCTAL CARCINOMA IN SITU, INTERMEDIATE NUCLEAR GRADE, CRIBRIFORM TYPE WITHOUT NECROSIS INVOLVING A FRAGMENTED PAPILLARY LESION NEGATIVE FOR INVASIVE CARCINOMA NEGATIVE FOR MICROCALCIFICATIONS 2. Lymph node, needle/core biopsy, left axillary, hydromark spiral clip COMPATIBLE WITH A BENIGN REACTIVE LYMPH NODE  PROGNOSTIC INDICATORS Results: IMMUNOHISTOCHEMICAL AND MORPHOMETRIC ANALYSIS PERFORMED MANUALLY Estrogen Receptor: 100%, POSITIVE, STRONG STAINING INTENSITY Progesterone Receptor: 95%, POSITIVE, STRONG STAINING INTENSITY REFERENCE RANGE ESTROGEN RECEPTOR NEGATIVE 0% POSITIVE =>1% REFERENCE RANGE PROGESTERONE RECEPTOR NEGATIVE 0% POSITIVE =>1% All controls stained appropriately Zhaoli Lane MD Pathologist, Electronic Signature ( Signed 01/19/2023)  CLINICAL DATA:  Screening recall for possible left breast asymmetry.   EXAM: DIGITAL DIAGNOSTIC UNILATERAL LEFT MAMMOGRAM WITH TOMOSYNTHESIS; ULTRASOUND LEFT BREAST LIMITED   TECHNIQUE: Left digital diagnostic mammography and breast tomosynthesis was performed.; Targeted ultrasound examination of the left breast was performed.   COMPARISON:  Previous exam(s).   ACR Breast Density Category b: There are scattered areas of fibroglandular density.   FINDINGS: Additional tomograms were performed of the  left breast. There is oval mass in the periareolar/slightly upper outer left breast measuring 0.9 cm. Dilated ducts are also present in the periareolar left breast.   Targeted ultrasound of the left breast was performed demonstrating a mass with the margin irregularity in the left breast at 2 o'clock 2 cm from nipple measuring 1 x 0.4 x 1 cm. This is associated with an adjacent duct and may represent an intraductal. This is felt to correspond well with the mass seen in the left breast at mammography. Several small lymph nodes are identified in the left axilla with a 1.2 cm lymph node with questionable/borderline cortical thickening of 0.4 cm.   IMPRESSION: 1. Suspicious 1 cm mass in the left breast at 2 o'clock 2 cm from nipple.   2. Lymph node with borderline cortical thickening in the left axilla.   RECOMMENDATION: 1. Recommend ultrasound-guided core biopsy of the mass in the left breast at 2 o'clock 2 cm from nipple.   2. Recommend ultrasound-guided core biopsy of the lymph node in the left axilla.   I have discussed the findings and recommendations with the patient. If applicable,   a reminder letter will be sent to the patient regarding the next appointment.   BI-RADS CATEGORY  4: Suspicious.     Electronically Signed   By: Jennifer  Jarosz M.D.   On: 01/09/2023 10:14    Assessment and Plan:  Diagnoses and all orders for this visit:  Ductal carcinoma in situ (DCIS) of left breast    I had a discussion with the patient and her daughter.  I also conferred with the other members of the treatment team.  We discussed her surgical options which include mastectomy versus breast conservation.  The patient has a relatively small area of ductal carcinoma in situ.  She is a good candidate for breast conservation.  We recommend left breast radioactive seed localized lumpectomy. The surgical procedure has been discussed with the patient.  Potential risks, benefits, alternative  treatments, and expected outcomes have been explained.  All of the patient's questions at this time have been answered.  The likelihood of reaching the patient's treatment goal is good.  The patient understand the proposed surgical procedure and wishes to proceed.  Surgery will likely be followed by radiation and antiestrogens.    Briley Bumgarner KAI Makell Drohan, MD  01/27/2023 2:40 PM 

## 2023-01-27 NOTE — Telephone Encounter (Signed)
Kaitlyn Ramsey was seen by a genetic counselor during the breast multidisciplinary clinic on 01/27/2023. In addition to her personal history of breast cancer, she reported a paternal aunt and paternal cousin diagnosed with cancer (unknown types at unknown ages). She does not meet NCCN criteria for genetic testing at this time. She was still offered genetic counseling and testing but declined. We encourage her to contact us if there are any changes to her personal or family history of cancer. If she meets NCCN criteria based on the updated personal/family history, she would be recommended to have genetic counseling and testing.   Kaitlyn Brothers, MS, [facility] Genetic Counselor [email or web address] (P) [phone]

## 2023-01-27 NOTE — Progress Notes (Signed)
Radiation Oncology         [phone] ________________________________  Multidisciplinary Breast Oncology Clinic [facility]) Initial Outpatient Consultation  Name: Kaitlyn Ramsey MRN: [id number]  Date: 01/27/2023  DOB: [birth year]  WU:[id number], Adrian Saran, MD  Manus Rudd, MD   REFERRING PHYSICIAN: Manus Rudd, MD  DIAGNOSIS: The encounter diagnosis was Ductal carcinoma in situ (DCIS) of left breast.  Stage 0 (cTis (DCIS), cN0, cM0) Left Breast, Intermediate grade DCIS, ER+ / PR+ / Her2 not assessed    ICD-10-CM   1. Ductal carcinoma in situ (DCIS) of left breast  D05.12       HISTORY OF PRESENT ILLNESS::Kaitlyn Ramsey is a 67 y.o. female who is presenting to the office today for evaluation of her newly diagnosed breast cancer. She is accompanied by her daughter. She is doing well overall.   She had routine screening mammography on 12/30/22 showing a possible abnormality in the left breast. She underwent  a left breast diagnostic mammography with tomography and left breast ultrasonography at [facility] on 01/09/23 showing: a suspicious 1 cm mass in the 2 o'clock left breast 2 cmfn and one abnormal left axillary lymph node with borderline cortical thickening.   Biopsy of the 2 o'clock left breast on 01/15/23 showed: intermediate grade DCIS involving a fragmented papillary lesion. Prognostic indicators significant for: estrogen receptor, 100% positive and progesterone receptor, 95% positive, both with strong staining intensity. HER2 not assessed.  Biopsy of the left axillary lymph node showed no evidence of malignancy.   Menarche: 40-34 years old Age at first live birth: 67 years old GP: 1 LMP: menopausal (LMP date unknown) Contraceptive: reports contraceptive use but does not remember any details regarding type or dates HRT: never used   The patient was referred today for presentation in the multidisciplinary conference.  Radiology studies and pathology slides were  presented there for review and discussion of treatment options.  A consensus was discussed regarding potential next steps.  PREVIOUS RADIATION THERAPY: No  PAST MEDICAL HISTORY: No past medical history on file.  PAST SURGICAL HISTORY: Past Surgical History:  Procedure Laterality Date   BREAST BIOPSY Left 01/15/2023   [place] LT BREAST BX W LOC DEV 1ST LESION IMG BX SPEC US GUIDE 01/15/2023 [facility] MAMMOGRAPHY    FAMILY HISTORY:  Family History  Problem Relation Age of Onset   Cancer Paternal Aunt        unknown type   Cancer Cousin        unknown type, paternal first cousin    SOCIAL HISTORY:  Social History   Socioeconomic History   Marital status: Single    Spouse name: Not on file   Number of children: Not on file   Years of education: Not on file   Highest education level: Not on file  Occupational History   Not on file  Tobacco Use   Smoking status: Former    Types: Cigarettes   Smokeless tobacco: Not on file  Substance and Sexual Activity   Alcohol use: Never   Drug use: Never   Sexual activity: Not on file  Other Topics Concern   Not on file  Social History Narrative   Not on file   Social Determinants of Health   Financial Resource Strain: Not on file  Food Insecurity: Not on file  Transportation Needs: Not on file  Physical Activity: Not on file  Stress: Not on file  Social Connections: Not on file    ALLERGIES: Not on File  MEDICATIONS:  Current Outpatient Medications  Medication Sig Dispense Refill   ALPRAZolam (XANAX) 0.5 MG tablet Take 1 tablet (0.5 mg total) by mouth daily as needed for anxiety. 10 tablet 0   No current facility-administered medications for this encounter.    REVIEW OF SYSTEMS: A 10+ POINT REVIEW OF SYSTEMS WAS OBTAINED including neurology, dermatology, psychiatry, cardiac, respiratory, lymph, extremities, GI, GU, musculoskeletal, constitutional, reproductive, HEENT. On the provided form, she reports hip and back pain, wearing  glasses, dental problems, dentures, breast pain, joint pain, arthritis, difficulties walking, and anxiety. She denies any other symptoms.    PHYSICAL EXAM:     01/27/2023  Vitals with BMI   Weight 207 lbs 10 oz   Systolic 138 !   Diastolic 59 !   Pulse 76       Lungs are clear to auscultation bilaterally. Heart has regular rate and rhythm. No palpable cervical, supraclavicular, or axillary adenopathy. Abdomen soft, non-tender, normal bowel sounds. Breast: Right breast with no palpable mass, nipple discharge, or bleeding. Left breast with biopsy site in the 2 o'clock position adjacent to the areolar border. Both breasts are large and pendulous.   KPS = 90  100 - Normal; no complaints; no evidence of disease. 90   - Able to carry on normal activity; minor signs or symptoms of disease. 80   - Normal activity with effort; some signs or symptoms of disease. 3   - Cares for self; unable to carry on normal activity or to do active work. 60   - Requires occasional assistance, but is able to care for most of his personal needs. 50   - Requires considerable assistance and frequent medical care. 40   - Disabled; requires special care and assistance. 30   - Severely disabled; hospital admission is indicated although death not imminent. 20   - Very sick; hospital admission necessary; active supportive treatment necessary. 10   - Moribund; fatal processes progressing rapidly. 0     - Dead  Karnofsky DA, Abelmann WH, Craver LS and Burchenal [facility] [phone]) The use of the nitrogen mustards in the palliative treatment of carcinoma: with particular reference to bronchogenic carcinoma Cancer 1 [id number]  LABORATORY DATA:  Lab Results  Component Value Date   WBC 6.3 01/27/2023   HGB 14.5 01/27/2023   HCT 39.5 01/27/2023   MCV 81.4 01/27/2023   PLT 278 01/27/2023   Lab Results  Component Value Date   NA 138 01/27/2023   K 3.8 01/27/2023   CL 100 01/27/2023   CO2 32 01/27/2023   Lab Results   Component Value Date   ALT 18 01/27/2023   AST 22 01/27/2023   ALKPHOS 75 01/27/2023   BILITOT 1.0 01/27/2023    PULMONARY FUNCTION TEST:   Review Flowsheet        No data to display          RADIOGRAPHY: [place] AXILLARY NODE CORE BIOPSY LEFT  Addendum Date: 01/18/2023   ADDENDUM REPORT: 01/18/2023 12:17 ADDENDUM: PATHOLOGY revealed: Site 1. Breast, LEFT, needle core biopsy, 2 o'clock, 2 cm fn, venus clip DUCTAL CARCINOMA IN SITU, INTERMEDIATE NUCLEAR GRADE, CRIBRIFORM TYPE WITHOUT NECROSIS INVOLVING A FRAGMENTED PAPILLARY LESION NEGATIVE FOR INVASIVE CARCINOMA NEGATIVE FOR MICROCALCIFICATIONS Pathology results are CONCORDANT with imaging findings, per Dr. Frederico Hamman. PATHOLOGY revealed: Site 2. Lymph node, needle/core biopsy, LEFT axillary, hydromark spiral clip COMPATIBLE WITH A BENIGN REACTIVE LYMPH NODE Pathology results are CONCORDANT with imaging findings, per Dr. Frederico Hamman. Pathology results and recommendations below were  discussed with patient by telephone on 01/18/2023. Patient reported biopsy site within normal limits with slight tenderness at the site. Post biopsy care instructions were reviewed, questions were answered and my direct phone number was provided to patient. Patient was instructed to call [facility] if any concerns or questions arise related to the biopsy. The patient was referred to the [facility] at [facility] with appointment on 01/27/2023. Pathology results reported by Lynett Grimes, RN on 01/18/2023. Electronically Signed   By: Frederico Hamman M.D.   On: 01/18/2023 12:17   Result Date: 01/18/2023 CLINICAL DATA:  67 year old female presenting for ultrasound-guided biopsy of a left breast mass and a left axillary lymph node. EXAM: ULTRASOUND GUIDED LEFT BREAST CORE NEEDLE BIOPSY COMPARISON:  Previous exam(s). PROCEDURE: I met with the patient and we discussed the procedure of  ultrasound-guided biopsy, including benefits and alternatives. We discussed the high likelihood of a successful procedure. We discussed the risks of the procedure, including infection, bleeding, tissue injury, clip migration, and inadequate sampling. Informed written consent was given. The usual time-out protocol was performed immediately prior to the procedure. Lesion quadrant: Upper outer quadrant Using sterile technique and 1% Lidocaine as local anesthetic, under direct ultrasound visualization, a 14 gauge spring-loaded device was used to perform biopsy of a left breast mass at 2 o'clock using an inferior approach. At the conclusion of the procedure venus shaped tissue marker clip was deployed into the biopsy cavity. Using sterile technique and 1% Lidocaine as local anesthetic, under direct ultrasound visualization, a 14 gauge spring-loaded device was used to perform biopsy of a left axillary lymph node using an inferior approach. At the conclusion of the procedure a HydroMARK spiral shaped tissue marker clip was deployed into the biopsy cavity. Follow up 2 view mammogram was performed and dictated separately. IMPRESSION: 1. Ultrasound guided biopsy of a left breast mass at 2 o'clock (venus clip). No apparent complications. 2. Ultrasound guided biopsy of a left axillary lymph node (HydroMARK spiral clip). No apparent complications. Electronically Signed: By: Frederico Hamman M.D. On: 01/15/2023 08:51  [place] LT BREAST BX W LOC DEV 1ST LESION IMG BX SPEC US GUIDE  Addendum Date: 01/18/2023   ADDENDUM REPORT: 01/18/2023 12:17 ADDENDUM: PATHOLOGY revealed: Site 1. Breast, LEFT, needle core biopsy, 2 o'clock, 2 cm fn, venus clip DUCTAL CARCINOMA IN SITU, INTERMEDIATE NUCLEAR GRADE, CRIBRIFORM TYPE WITHOUT NECROSIS INVOLVING A FRAGMENTED PAPILLARY LESION NEGATIVE FOR INVASIVE CARCINOMA NEGATIVE FOR MICROCALCIFICATIONS Pathology results are CONCORDANT with imaging findings, per Dr. Frederico Hamman. PATHOLOGY  revealed: Site 2. Lymph node, needle/core biopsy, LEFT axillary, hydromark spiral clip COMPATIBLE WITH A BENIGN REACTIVE LYMPH NODE Pathology results are CONCORDANT with imaging findings, per Dr. Frederico Hamman. Pathology results and recommendations below were discussed with patient by telephone on 01/18/2023. Patient reported biopsy site within normal limits with slight tenderness at the site. Post biopsy care instructions were reviewed, questions were answered and my direct phone number was provided to patient. Patient was instructed to call [facility] if any concerns or questions arise related to the biopsy. The patient was referred to the [facility] at [facility] with appointment on 01/27/2023. Pathology results reported by Lynett Grimes, RN on 01/18/2023. Electronically Signed   By: Frederico Hamman M.D.   On: 01/18/2023 12:17   Result Date: 01/18/2023 CLINICAL DATA:  67 year old female presenting for ultrasound-guided biopsy of a left breast mass and a  left axillary lymph node. EXAM: ULTRASOUND GUIDED LEFT BREAST CORE NEEDLE BIOPSY COMPARISON:  Previous exam(s). PROCEDURE: I met with the patient and we discussed the procedure of ultrasound-guided biopsy, including benefits and alternatives. We discussed the high likelihood of a successful procedure. We discussed the risks of the procedure, including infection, bleeding, tissue injury, clip migration, and inadequate sampling. Informed written consent was given. The usual time-out protocol was performed immediately prior to the procedure. Lesion quadrant: Upper outer quadrant Using sterile technique and 1% Lidocaine as local anesthetic, under direct ultrasound visualization, a 14 gauge spring-loaded device was used to perform biopsy of a left breast mass at 2 o'clock using an inferior approach. At the conclusion of the procedure venus shaped tissue marker clip was deployed into  the biopsy cavity. Using sterile technique and 1% Lidocaine as local anesthetic, under direct ultrasound visualization, a 14 gauge spring-loaded device was used to perform biopsy of a left axillary lymph node using an inferior approach. At the conclusion of the procedure a HydroMARK spiral shaped tissue marker clip was deployed into the biopsy cavity. Follow up 2 view mammogram was performed and dictated separately. IMPRESSION: 1. Ultrasound guided biopsy of a left breast mass at 2 o'clock (venus clip). No apparent complications. 2. Ultrasound guided biopsy of a left axillary lymph node (HydroMARK spiral clip). No apparent complications. Electronically Signed: By: Frederico Hamman M.D. On: 01/15/2023 08:51  MM CLIP PLACEMENT LEFT  Result Date: 01/15/2023 CLINICAL DATA:  Post biopsy mammogram of the left breast for clip placement. EXAM: 3D DIAGNOSTIC LEFT MAMMOGRAM POST ULTRASOUND BIOPSY COMPARISON:  Previous exam(s). FINDINGS: 3D Mammographic images were obtained following ultrasound guided biopsy of a left breast mass and left axillary lymph node. The biopsy marking clip within the breast in expected position at the site of biopsy. The clip placed at the biopsy site in the left axilla cannot be visualized due to its deep positioning. IMPRESSION: 1. Appropriate positioning of the venus shaped biopsy marking clip at the site of biopsy in the upper outer anterior left breast. 2. The clip in the left axilla cannot be visualized due to its deep positioning. Final Assessment: Post Procedure Mammograms for Marker Placement Electronically Signed   By: Frederico Hamman M.D.   On: 01/15/2023 08:56  MM 3D DIAGNOSTIC MAMMOGRAM UNILATERAL LEFT BREAST  Result Date: 01/09/2023 CLINICAL DATA:  Screening recall for possible left breast asymmetry. EXAM: DIGITAL DIAGNOSTIC UNILATERAL LEFT MAMMOGRAM WITH TOMOSYNTHESIS; ULTRASOUND LEFT BREAST LIMITED TECHNIQUE: Left digital diagnostic mammography and breast tomosynthesis was  performed.; Targeted ultrasound examination of the left breast was performed. COMPARISON:  Previous exam(s). ACR Breast Density Category b: There are scattered areas of fibroglandular density. FINDINGS: Additional tomograms were performed of the left breast. There is oval mass in the periareolar/slightly upper outer left breast measuring 0.9 cm. Dilated ducts are also present in the periareolar left breast. Targeted ultrasound of the left breast was performed demonstrating a mass with the margin irregularity in the left breast at 2 o'clock 2 cm from nipple measuring 1 x 0.4 x 1 cm. This is associated with an adjacent duct and may represent an intraductal. This is felt to correspond well with the mass seen in the left breast at mammography. Several small lymph nodes are identified in the left axilla with a 1.2 cm lymph node with questionable/borderline cortical thickening of 0.4 cm. IMPRESSION: 1. Suspicious 1 cm mass in the left breast at 2 o'clock 2 cm from nipple. 2. Lymph node with borderline cortical thickening in  the left axilla. RECOMMENDATION: 1. Recommend ultrasound-guided core biopsy of the mass in the left breast at 2 o'clock 2 cm from nipple. 2. Recommend ultrasound-guided core biopsy of the lymph node in the left axilla. I have discussed the findings and recommendations with the patient. If applicable, a reminder letter will be sent to the patient regarding the next appointment. BI-RADS CATEGORY  4: Suspicious. Electronically Signed   By: Edwin Cap M.D.   On: 01/09/2023 10:14  [place] LIMITED ULTRASOUND INCLUDING AXILLA LEFT BREAST   Result Date: 01/09/2023 CLINICAL DATA:  Screening recall for possible left breast asymmetry. EXAM: DIGITAL DIAGNOSTIC UNILATERAL LEFT MAMMOGRAM WITH TOMOSYNTHESIS; ULTRASOUND LEFT BREAST LIMITED TECHNIQUE: Left digital diagnostic mammography and breast tomosynthesis was performed.; Targeted ultrasound examination of the left breast was performed. COMPARISON:  Previous  exam(s). ACR Breast Density Category b: There are scattered areas of fibroglandular density. FINDINGS: Additional tomograms were performed of the left breast. There is oval mass in the periareolar/slightly upper outer left breast measuring 0.9 cm. Dilated ducts are also present in the periareolar left breast. Targeted ultrasound of the left breast was performed demonstrating a mass with the margin irregularity in the left breast at 2 o'clock 2 cm from nipple measuring 1 x 0.4 x 1 cm. This is associated with an adjacent duct and may represent an intraductal. This is felt to correspond well with the mass seen in the left breast at mammography. Several small lymph nodes are identified in the left axilla with a 1.2 cm lymph node with questionable/borderline cortical thickening of 0.4 cm. IMPRESSION: 1. Suspicious 1 cm mass in the left breast at 2 o'clock 2 cm from nipple. 2. Lymph node with borderline cortical thickening in the left axilla. RECOMMENDATION: 1. Recommend ultrasound-guided core biopsy of the mass in the left breast at 2 o'clock 2 cm from nipple. 2. Recommend ultrasound-guided core biopsy of the lymph node in the left axilla. I have discussed the findings and recommendations with the patient. If applicable, a reminder letter will be sent to the patient regarding the next appointment. BI-RADS CATEGORY  4: Suspicious. Electronically Signed   By: Edwin Cap M.D.   On: 01/09/2023 10:14  MM 3D SCREENING MAMMOGRAM BILATERAL BREAST  Result Date: 12/31/2022 CLINICAL DATA:  Screening. EXAM: DIGITAL SCREENING BILATERAL MAMMOGRAM WITH TOMOSYNTHESIS AND CAD TECHNIQUE: Bilateral screening digital craniocaudal and mediolateral oblique mammograms were obtained. Bilateral screening digital breast tomosynthesis was performed. The images were evaluated with computer-aided detection. COMPARISON:  Previous exam(s). ACR Breast Density Category a: The breasts are almost entirely fatty. FINDINGS: In the left breast, a  possible asymmetry warrants further evaluation. In the right breast, no findings suspicious for malignancy. IMPRESSION: Further evaluation is suggested for possible asymmetry in the left breast. RECOMMENDATION: Diagnostic mammogram and possibly ultrasound of the left breast. (Code:[id number]) The patient will be contacted regarding the findings, and additional imaging will be scheduled. BI-RADS CATEGORY  0: Incomplete: Need additional imaging evaluation. Electronically Signed   By: Sande Brothers M.D.   On: 12/31/2022 10:12      IMPRESSION: Stage 0 (cTis (DCIS), cN0, cM0) Left Breast, Intermediate grade DCIS, ER+ / PR+ / Her2 not assessed   Patient will be a good candidate for breast conservation with radiotherapy to the left breast. We discussed the general course of radiation, potential side effects, and toxicities with radiation and the patient is interested in this approach.   PLAN:  Genetics  Left lumpectomy  Adjuvant radiation therapy  Aromatase inhibitor    ------------------------------------------------  Billie Lade, PhD, MD  This document serves as a record of services personally performed by Antony Blackbird, MD. It was created on his behalf by Neena Rhymes, a trained medical scribe. The creation of this record is based on the scribe's personal observations and the provider's statements to them. This document has been checked and approved by the attending provider.

## 2023-01-27 NOTE — Progress Notes (Signed)
[facility] Clinical Social Work  Initial Assessment   Kaitlyn Ramsey is a 67 y.o. year old female accompanied by daughter, Marijo Conception. Clinical Social Work was referred by  [facility]  for assessment of psychosocial needs.   SDOH (Social Determinants of Health) assessments performed: Yes SDOH Interventions    Flowsheet Row Clinical Support from 01/27/2023 in [facility] at [facility]  SDOH Interventions   Food Insecurity Interventions Intervention Not Indicated  Housing Interventions Intervention Not Indicated  Transportation Interventions Intervention Not Indicated  Utilities Interventions Intervention Not Indicated       SDOH Screenings   Food Insecurity: No Food Insecurity (01/27/2023)  Housing: Low Risk  (01/27/2023)  Transportation Needs: No Transportation Needs (01/27/2023)  Utilities: Not At Risk (01/27/2023)  Tobacco Use: Medium Risk (01/27/2023)     Distress Screen completed: Yes    01/27/2023    1:38 PM  ONCBCN DISTRESS SCREENING  Screening Type Initial Screening  Practical problem type Transportation  Emotional problem type Nervousness/Anxiety  Physical Problem type Pain;Sleep/insomnia      Family/Social Information:  Housing Arrangement: patient lives with her daughter. Has 4 grandkids ages 65-27 and 5 great grandkids ages a few months-6 years Family members/support persons in your life? Family Transportation concerns: yes, potentially with cost of gas  Employment: Retired.  Income source: Actor concerns:  Yes, potentially with cost of gas during daily radiation Type of concern: Transportation Food access concerns: no Religious or spiritual practice: [place] is an important coping mechanism in patient's life Services Currently in place:  [employer] [facility]  Coping/ Adjustment to diagnosis: Patient understands treatment plan and what happens next? yes, feeling better after receiving more information from the medical  team Concerns about diagnosis and/or treatment:  cost of transportation for daily treatment Patient reported stressors: Transportation, nervousness Patient enjoys watching TV and time with family/ friends Current coping skills/ strengths: Manufacturing systems engineer , Motivation for treatment/growth , Religious Affiliation , and Supportive family/friends     SUMMARY: Current SDOH Barriers:  Transportation- cost concerns with daily treatment  Clinical Social Work Clinical Goal(s):  Patient will follow up with Armed forces training and education officer when starting radiation* as directed by SW  Interventions: Discussed common feeling and emotions when being diagnosed with cancer, and the importance of support during treatment Informed patient of the support team roles and support services at [facility] Provided CSW contact information and encouraged patient to call with any questions or concerns Provided patient with information about Licensed conveyancer and potential assistance while undergoing radiation treatment   Follow Up Plan: Patient will contact CSW with any support or resource needs Patient verbalizes understanding of plan: Yes    Rawson Minix E Alonna Bartling, LCSW Clinical Social Worker [facility]

## 2023-01-27 NOTE — Progress Notes (Signed)
[facility] CONSULT NOTE  Patient Care Team: Renaye Rakers, MD as PCP - General (Family Medicine) Rachel Moulds, MD as Consulting Physician (Hematology and Oncology) Antony Blackbird, MD as Consulting Physician (Radiation Oncology) Manus Rudd, MD as Consulting Physician (General Surgery) Pershing Proud, RN as Oncology Nurse Navigator Donnelly Angelica, RN as Oncology Nurse Navigator  CHIEF COMPLAINTS/PURPOSE OF CONSULTATION:  Newly diagnosed breast cancer  HISTORY OF PRESENTING ILLNESS:  Kaitlyn Ramsey 67 y.o. female is here because of recent diagnosis of left breast DCIS  I reviewed her records extensively and collaborated the history with the patient.  SUMMARY OF ONCOLOGIC HISTORY: Oncology History  Ductal carcinoma in situ (DCIS) of left breast  12/30/2022 Mammogram   Bilateral screening mammogram showed a possible asymmetry in the left breast.  Diagnostic mammogram showed suspicious 1 cm mass in the left breast at 2:00 2 cm from the nipple, lymph node with borderline cortical thickening in the left axilla   01/15/2023 Pathology Results   Left breast needle core biopsy at 2:00 showed DCIS, intermediate nuclear grade, cribriform type with necrosis, negative for invasive carcinoma.  Lymph node was benign and reactive.  Prognostic showed ER positive at 100% strong staining PR 95% positive strong staining   01/26/2023 Initial Diagnosis   Ductal carcinoma in situ (DCIS) of left breast    This is a very pleasant 67 year old female patient with newly diagnosed left breast DCIS ER/PR positive referred to breast [facility] for additional recommendations.  She arrived to the appointment today with her daughter.  She denies any breast changes prior to the mammogram.  She is on Ozempic for weight loss, her hemoglobin A1c is at 6 and is well-controlled.  She has some arthritis related pain.  Rest of the pertinent 10 point ROS reviewed and negative  MEDICAL HISTORY:  No past medical  history on file.  SURGICAL HISTORY: Past Surgical History:  Procedure Laterality Date   BREAST BIOPSY Left 01/15/2023   [place] LT BREAST BX W LOC DEV 1ST LESION IMG BX SPEC US GUIDE 01/15/2023 [facility] MAMMOGRAPHY    SOCIAL HISTORY: Social History   Socioeconomic History   Marital status: Single    Spouse name: Not on file   Number of children: Not on file   Years of education: Not on file   Highest education level: Not on file  Occupational History   Not on file  Tobacco Use   Smoking status: Former    Types: Cigarettes   Smokeless tobacco: Not on file  Substance and Sexual Activity   Alcohol use: Never   Drug use: Never   Sexual activity: Not on file  Other Topics Concern   Not on file  Social History Narrative   Not on file   Social Determinants of Health   Financial Resource Strain: Not on file  Food Insecurity: Not on file  Transportation Needs: Not on file  Physical Activity: Not on file  Stress: Not on file  Social Connections: Not on file  Intimate Partner Violence: Not on file    FAMILY HISTORY: No family history on file.  ALLERGIES:  has no allergies on file.  MEDICATIONS:  Current Outpatient Medications  Medication Sig Dispense Refill   ALPRAZolam (XANAX) 0.5 MG tablet Take 0.5 mg by mouth daily as needed for anxiety.     No current facility-administered medications for this visit.  Medication reconciliation to be updated  PHYSICAL EXAMINATION: ECOG PERFORMANCE STATUS: 0 - Asymptomatic  Vitals:   01/27/23 0913  BP: (!) 138/59  Pulse: 76  Resp: 18  Temp: 98.7 F (37.1 C)  SpO2: 99%   Filed Weights   01/27/23 0913  Weight: 207 lb 9.6 oz (94.2 kg)    GENERAL:alert, no distress and comfortable Breast: Bilateral breasts inspected and palpated.  No palpable breast  LABORATORY DATA:  I have reviewed the data as listed Lab Results  Component Value Date   WBC 6.3 01/27/2023   HGB 14.5 01/27/2023   HCT 39.5 01/27/2023   MCV 81.4 01/27/2023    PLT 278 01/27/2023   Lab Results  Component Value Date   NA 138 01/27/2023   K 3.8 01/27/2023   CL 100 01/27/2023   CO2 32 01/27/2023    RADIOGRAPHIC STUDIES: I have personally reviewed the radiological reports and agreed with the findings in the report.  ASSESSMENT AND PLAN:  Ductal carcinoma in situ (DCIS) of left breast This is a very pleasant 67 yr old female patient with new diagnosis of left breast DCIS, ER PR positive referred to breast [facility] for recommendations. We have discussed the following findings about DCIS.  Pathology review: I discussed with the patient the difference between DCIS and invasive breast cancer. It is considered a precancerous lesion. DCIS is classified as a Stage 0 breast cancer. It is generally detected through mammograms as calcifications. We discussed the significance of grades and its impact on prognosis. We also discussed the importance of ER and PR receptors and their implications to adjuvant treatment options. Prognosis of DCIS dependence on grade and degree of comedo necrosis. It is anticipated that if not treated, 20-30% of DCIS can develop into invasive breast cancer.  Recommendation: 1. Breast conserving surgery 2. Followed by adjuvant radiation therapy 3. Followed by antiestrogen therapy with tamoxifen/aromatase inhibitors based on menopausal status 5 years  Tamoxifen counseling: We discussed the risks and benefits of tamoxifen. These include but not limited to insomnia, hot flashes, mood changes, vaginal dryness, and weight gain. Although rare, serious side effects including endometrial cancer, risk of blood clots were also discussed. We strongly believe that the benefits far outweigh the risks. Patient understands these risks and consented to starting treatment. Planned treatment duration is 5 years.  Aromatase inhibitors counseling: We have discussed the mechanism of action of aromatase inhibitors today.  We have discussed adverse effects  including but not limited to menopausal symptoms, increased risk of osteoporosis and fractures, cardiovascular events, arthralgias and myalgias.  We do believe that the benefits far outweigh the risks.  Plan treatment duration of 5 years.  She will return to clinic after completion of radiation to initiate antiestrogen therapy.  She has some situational anxiety and request a short prescription of Xanax.  She understands that we cannot refill this medication long term.  All questions were answered. The patient knows to call the clinic with any problems, questions or concerns.    Rachel Moulds, MD 01/27/23

## 2023-01-29 ENCOUNTER — Other Ambulatory Visit: Payer: Self-pay | Admitting: Surgery

## 2023-01-29 DIAGNOSIS — D0512 Intraductal carcinoma in situ of left breast: Secondary | ICD-10-CM

## 2023-02-03 DIAGNOSIS — Z1231 Encounter for screening mammogram for malignant neoplasm of breast: Secondary | ICD-10-CM

## 2023-02-05 ENCOUNTER — Telehealth: Payer: Self-pay | Admitting: *Deleted

## 2023-02-05 ENCOUNTER — Encounter: Payer: Self-pay | Admitting: *Deleted

## 2023-02-05 ENCOUNTER — Telehealth: Payer: Self-pay | Admitting: Radiation Oncology

## 2023-02-05 DIAGNOSIS — D0512 Intraductal carcinoma in situ of left breast: Secondary | ICD-10-CM

## 2023-02-05 NOTE — Telephone Encounter (Signed)
Called patient to schedule a consultation w. Dr. Kinard. No answer, LVM for a return call.  

## 2023-02-05 NOTE — Telephone Encounter (Signed)
Spoke with patient to follow up from [facility] 5/29 and assess navigation needs. Patient denies any questions or concerns at this time.  Encouraged her to call should anything arise. Patient verbalized understanding.  

## 2023-02-09 NOTE — Pre-Procedure Instructions (Signed)
Surgical Instructions    Your procedure is scheduled on February 17, 2023.  Report to [facility] Main Entrance "A" at 8:30 A.M., then check in with the Admitting office.  Call this number if you have problems the morning of surgery:  [phone]  If you have any questions prior to your surgery date call [phone]: Open Monday-Friday 8am-4pm If you experience any cold or flu symptoms such as cough, fever, chills, shortness of breath, etc. between now and your scheduled surgery, please notify us at the above number.     Remember:  Do not eat after midnight the night before your surgery  You may drink clear liquids until 7:30 AM the morning of your surgery.   Clear liquids allowed are: Water, Non-Citrus Juices (without pulp), Carbonated Beverages, Clear Tea, Black Coffee Only (NO MILK, CREAM OR POWDERED CREAMER of any kind), and Gatorade.     Take these medicines the morning of surgery with A SIP OF WATER:  amLODipine (NORVASC)   atorvastatin (LIPITOR)   gabapentin (NEURONTIN)   metoprolol tartrate (LOPRESSOR)     May take these medicines IF NEEDED:  oxyCODONE-acetaminophen (PERCOCET)   ALPRAZolam Prudy Feeler)     STOP taking your Semaglutide [facility]) one week prior to surgery. Your last dose will be June 11th.   As of today, STOP taking any Aspirin (unless otherwise instructed by your surgeon) Aleve, Naproxen, Ibuprofen, Motrin, Advil, Goody's, BC's, all herbal medications, fish oil, and all vitamins.                     Do NOT Smoke (Tobacco/Vaping) for 24 hours prior to your procedure.  If you use a CPAP at night, you may bring your mask/headgear for your overnight stay.   Contacts, glasses, piercing's, hearing aid's, dentures or partials may not be worn into surgery, please bring cases for these belongings.    For patients admitted to the hospital, discharge time will be determined by your treatment team.   Patients discharged the day of surgery will not be allowed to drive  home, and someone needs to stay with them for 24 hours.  SURGICAL WAITING ROOM VISITATION Patients having surgery or a procedure may have no more than 2 support people in the waiting area - these visitors may rotate.   Children under the age of 42 must have an adult with them who is not the patient. If the patient needs to stay at the hospital during part of their recovery, the visitor guidelines for inpatient rooms apply. Pre-op nurse will coordinate an appropriate time for 1 support person to accompany patient in pre-op.  This support person may not rotate.   Please refer to the [facility] website for the visitor guidelines for Inpatients (after your surgery is over and you are in a regular room).    Special instructions:   [place]- Preparing For Surgery  Before surgery, you can play an important role. Because skin is not sterile, your skin needs to be as free of germs as possible. You can reduce the number of germs on your skin by washing with CHG (chlorahexidine gluconate) Soap before surgery.  CHG is an antiseptic cleaner which kills germs and bonds with the skin to continue killing germs even after washing.    Oral Hygiene is also important to reduce your risk of infection.  Remember - BRUSH YOUR TEETH THE MORNING OF SURGERY WITH YOUR REGULAR TOOTHPASTE  Please do not use if you have an allergy to CHG or antibacterial  soaps. If your skin becomes reddened/irritated stop using the CHG.  Do not shave (including legs and underarms) for at least 48 hours prior to first CHG shower. It is OK to shave your face.  Please follow these instructions carefully.   Shower the NIGHT BEFORE SURGERY and the MORNING OF SURGERY  If you chose to wash your hair, wash your hair first as usual with your normal shampoo.  After you shampoo, rinse your hair and body thoroughly to remove the shampoo.  Use CHG Soap as you would any other liquid soap. You can apply CHG directly to the skin and wash gently  with a scrungie or a clean washcloth.   Apply the CHG Soap to your body ONLY FROM THE NECK DOWN.  Do not use on open wounds or open sores. Avoid contact with your eyes, ears, mouth and genitals (private parts). Wash Face and genitals (private parts)  with your normal soap.   Wash thoroughly, paying special attention to the area where your surgery will be performed.  Thoroughly rinse your body with warm water from the neck down.  DO NOT shower/wash with your normal soap after using and rinsing off the CHG Soap.  Pat yourself dry with a CLEAN TOWEL.  Wear CLEAN PAJAMAS to bed the night before surgery  Place CLEAN SHEETS on your bed the night before your surgery  DO NOT SLEEP WITH PETS.   Day of Surgery: Take a shower with CHG soap. Do not wear jewelry or makeup Do not wear lotions, powders, perfumes/colognes, or deodorant. Do not shave 48 hours prior to surgery.  Men may shave face and neck. Do not bring valuables to the hospital.  [facility] is not responsible for any belongings or valuables. Do not wear nail polish, gel polish, artificial nails, or any other type of covering on natural nails (fingers and toes) If you have artificial nails or gel coating that need to be removed by a nail salon, please have this removed prior to surgery. Artificial nails or gel coating may interfere with anesthesia's ability to adequately monitor your vital signs.  Wear Clean/Comfortable clothing the morning of surgery Remember to brush your teeth WITH YOUR REGULAR TOOTHPASTE.   Please read over the following fact sheets that you were given.    If you received a COVID test during your pre-op visit  it is requested that you wear a mask when out in public, stay away from anyone that may not be feeling well and notify your surgeon if you develop symptoms. If you have been in contact with anyone that has tested positive in the last 10 days please notify you surgeon.

## 2023-02-10 ENCOUNTER — Other Ambulatory Visit: Payer: Self-pay

## 2023-02-10 ENCOUNTER — Encounter (HOSPITAL_COMMUNITY): Payer: Self-pay

## 2023-02-10 ENCOUNTER — Encounter (HOSPITAL_COMMUNITY)
Admission: RE | Admit: 2023-02-10 | Discharge: 2023-02-10 | Disposition: A | Discharge status: Home / Self Care | Payer: Medicare HMO | Source: Ambulatory Visit | Attending: Surgery | Admitting: Surgery

## 2023-02-10 VITALS — BP 161/72 | HR 76 | Temp 98.0°F | Resp 18 | Ht 66.0 in | Wt 210.0 lb

## 2023-02-10 DIAGNOSIS — Z01812 Encounter for preprocedural laboratory examination: Secondary | ICD-10-CM | POA: Insufficient documentation

## 2023-02-10 DIAGNOSIS — I10 Essential (primary) hypertension: Secondary | ICD-10-CM | POA: Diagnosis not present

## 2023-02-10 HISTORY — DX: Gastritis, unspecified, without bleeding: K29.70

## 2023-02-10 HISTORY — DX: Cardiac arrhythmia, unspecified: I49.9

## 2023-02-10 HISTORY — DX: Anxiety disorder, unspecified: F41.9

## 2023-02-10 HISTORY — DX: Essential (primary) hypertension: I10

## 2023-02-10 NOTE — Progress Notes (Signed)
PCP:Dr. Parke Simmers Cardiologist: No cardiologist since moving to [place] 4 years ago. Saw a MD in [place], but never needed follow up. (Name might have been Dr. Mechele Dawley?? But pt states the MD retired)  EKG: Today CXR: ECHO: denies Stress Test: denies Cardiac Cath: pt thinks she had a cath in [place] 10-15 years ago, was told everything was fine.   Ozempic for weight loss, LD 5/28  Patient denies shortness of breath, fever, cough, and chest pain at PAT appointment.  Patient verbalized understanding of instructions provided today at the PAT appointment.  Patient asked to review instructions at home and day of surgery.

## 2023-02-16 ENCOUNTER — Ambulatory Visit
Admission: RE | Admit: 2023-02-16 | Discharge: 2023-02-16 | Disposition: A | Discharge status: Home / Self Care | Payer: Medicare HMO | Source: Ambulatory Visit | Attending: Surgery | Admitting: Surgery

## 2023-02-16 DIAGNOSIS — D0512 Intraductal carcinoma in situ of left breast: Secondary | ICD-10-CM

## 2023-02-16 HISTORY — PX: BREAST BIOPSY: SHX20

## 2023-02-16 NOTE — Anesthesia Preprocedure Evaluation (Signed)
Anesthesia Evaluation  Patient identified by MRN, date of birth, ID band Patient awake    Reviewed: Allergy & Precautions, H&P , NPO status , Patient's Chart, lab work & pertinent test results  Airway Mallampati: I  TM Distance: >3 FB Neck ROM: Full    Dental no notable dental hx. (+) Edentulous Upper, Poor Dentition, Missing, Chipped,    Pulmonary neg pulmonary ROS, former smoker   Pulmonary exam normal breath sounds clear to auscultation       Cardiovascular Exercise Tolerance: Good hypertension, Pt. on medications and Pt. on home beta blockers negative cardio ROS Normal cardiovascular exam+ dysrhythmias  Rhythm:Regular Rate:Normal     Neuro/Psych   Anxiety     negative neurological ROS  negative psych ROS   GI/Hepatic negative GI ROS, Neg liver ROS,,,  Endo/Other  negative endocrine ROS    Renal/GU negative Renal ROS  negative genitourinary   Musculoskeletal negative musculoskeletal ROS (+)    Abdominal   Peds negative pediatric ROS (+)  Hematology negative hematology ROS (+)   Anesthesia Other Findings   Reproductive/Obstetrics negative OB ROS                             Anesthesia Physical Anesthesia Plan  ASA: 3  Anesthesia Plan: General   Post-op Pain Management: Minimal or no pain anticipated   Induction: Intravenous  PONV Risk Score and Plan: 2 and Ondansetron, Dexamethasone and Treatment may vary due to age or medical condition  Airway Management Planned: LMA  Additional Equipment: None  Intra-op Plan:   Post-operative Plan: Extubation in OR  Informed Consent: I have reviewed the patients History and Physical, chart, labs and discussed the procedure including the risks, benefits and alternatives for the proposed anesthesia with the patient or authorized representative who has indicated his/her understanding and acceptance.       Plan Discussed with: CRNA and  Anesthesiologist  Anesthesia Plan Comments: ( )       Anesthesia Quick Evaluation

## 2023-02-17 ENCOUNTER — Ambulatory Visit
Admission: RE | Admit: 2023-02-17 | Discharge: 2023-02-17 | Disposition: A | Discharge status: Home / Self Care | Payer: Medicare HMO | Source: Ambulatory Visit | Attending: Surgery | Admitting: Surgery

## 2023-02-17 ENCOUNTER — Encounter (HOSPITAL_COMMUNITY): Payer: Self-pay | Admitting: Surgery

## 2023-02-17 ENCOUNTER — Ambulatory Visit (HOSPITAL_BASED_OUTPATIENT_CLINIC_OR_DEPARTMENT_OTHER): Payer: Medicare HMO | Admitting: Anesthesiology

## 2023-02-17 ENCOUNTER — Ambulatory Visit (HOSPITAL_COMMUNITY)
Admission: RE | Admit: 2023-02-17 | Discharge: 2023-02-17 | Disposition: A | Discharge status: Home / Self Care | Payer: Medicare HMO | Attending: Surgery | Admitting: Surgery

## 2023-02-17 ENCOUNTER — Encounter (HOSPITAL_COMMUNITY)
Admission: RE | Disposition: A | Discharge status: Home / Self Care | Payer: Self-pay | Source: Home / Self Care | Attending: Surgery

## 2023-02-17 ENCOUNTER — Other Ambulatory Visit: Payer: Self-pay

## 2023-02-17 ENCOUNTER — Ambulatory Visit (HOSPITAL_COMMUNITY): Payer: Medicare HMO | Admitting: Physician Assistant

## 2023-02-17 DIAGNOSIS — Z170 Estrogen receptor positive status [ER+]: Secondary | ICD-10-CM | POA: Diagnosis not present

## 2023-02-17 DIAGNOSIS — F419 Anxiety disorder, unspecified: Secondary | ICD-10-CM

## 2023-02-17 DIAGNOSIS — I10 Essential (primary) hypertension: Secondary | ICD-10-CM

## 2023-02-17 DIAGNOSIS — D0512 Intraductal carcinoma in situ of left breast: Secondary | ICD-10-CM | POA: Insufficient documentation

## 2023-02-17 DIAGNOSIS — Z87891 Personal history of nicotine dependence: Secondary | ICD-10-CM

## 2023-02-17 HISTORY — PX: BREAST LUMPECTOMY WITH RADIOACTIVE SEED LOCALIZATION: SHX6424

## 2023-02-17 HISTORY — PX: BREAST LUMPECTOMY: SHX2

## 2023-02-17 SURGERY — BREAST LUMPECTOMY WITH RADIOACTIVE SEED LOCALIZATION
Anesthesia: General | Site: Breast | Laterality: Left

## 2023-02-17 MED ORDER — ACETAMINOPHEN 160 MG/5ML PO SOLN: 325.0000 mg | ORAL | Status: DC | PRN

## 2023-02-17 MED ORDER — CHLORHEXIDINE GLUCONATE 0.12 % MT SOLN
15.0000 mL | Freq: Once | OROMUCOSAL | Status: AC
  Administered 2023-02-17: 15 mL via OROMUCOSAL
  Filled 2023-02-17: qty 15

## 2023-02-17 MED ORDER — LIDOCAINE 2% (20 MG/ML) 5 ML SYRINGE
INTRAMUSCULAR | Status: DC | PRN
  Administered 2023-02-17: 60 mg via INTRAVENOUS

## 2023-02-17 MED ORDER — ONDANSETRON HCL 4 MG/2ML IJ SOLN
INTRAMUSCULAR | Status: DC | PRN
  Administered 2023-02-17: 4 mg via INTRAVENOUS

## 2023-02-17 MED ORDER — BUPIVACAINE-EPINEPHRINE 0.25% -1:200000 IJ SOLN
INTRAMUSCULAR | Status: DC | PRN
  Administered 2023-02-17: 10 mL

## 2023-02-17 MED ORDER — PHENYLEPHRINE 80 MCG/ML (10ML) SYRINGE FOR IV PUSH (FOR BLOOD PRESSURE SUPPORT)
PREFILLED_SYRINGE | INTRAVENOUS | Status: AC
  Filled 2023-02-17: qty 10

## 2023-02-17 MED ORDER — 0.9 % SODIUM CHLORIDE (POUR BTL) OPTIME
TOPICAL | Status: DC | PRN
  Administered 2023-02-17: 1000 mL

## 2023-02-17 MED ORDER — EPHEDRINE SULFATE-NACL 50-0.9 MG/10ML-% IV SOSY
PREFILLED_SYRINGE | INTRAVENOUS | Status: DC | PRN
  Administered 2023-02-17: 5 mg via INTRAVENOUS

## 2023-02-17 MED ORDER — CHLORHEXIDINE GLUCONATE CLOTH 2 % EX PADS: 6.0000 | MEDICATED_PAD | Freq: Once | CUTANEOUS | Status: DC

## 2023-02-17 MED ORDER — BUPIVACAINE-EPINEPHRINE (PF) 0.25% -1:200000 IJ SOLN
INTRAMUSCULAR | Status: AC
  Filled 2023-02-17: qty 30

## 2023-02-17 MED ORDER — MIDAZOLAM HCL 2 MG/2ML IJ SOLN
INTRAMUSCULAR | Status: DC | PRN
  Administered 2023-02-17: 2 mg via INTRAVENOUS

## 2023-02-17 MED ORDER — ACETAMINOPHEN 325 MG PO TABS: 325.0000 mg | ORAL_TABLET | ORAL | Status: DC | PRN

## 2023-02-17 MED ORDER — PROPOFOL 10 MG/ML IV BOLUS
INTRAVENOUS | Status: AC
  Filled 2023-02-17: qty 20

## 2023-02-17 MED ORDER — ORAL CARE MOUTH RINSE: 15.0000 mL | Freq: Once | OROMUCOSAL | Status: AC

## 2023-02-17 MED ORDER — DEXAMETHASONE SODIUM PHOSPHATE 10 MG/ML IJ SOLN
INTRAMUSCULAR | Status: DC | PRN
  Administered 2023-02-17: 5 mg via INTRAVENOUS

## 2023-02-17 MED ORDER — OXYCODONE HCL 5 MG/5ML PO SOLN: 5.0000 mg | Freq: Once | ORAL | Status: AC | PRN

## 2023-02-17 MED ORDER — OXYCODONE HCL 5 MG PO TABS
ORAL_TABLET | ORAL | Status: AC
  Filled 2023-02-17: qty 1

## 2023-02-17 MED ORDER — CEFAZOLIN SODIUM-DEXTROSE 2-4 GM/100ML-% IV SOLN
2.0000 g | INTRAVENOUS | Status: AC
  Administered 2023-02-17: 2 g via INTRAVENOUS
  Filled 2023-02-17: qty 100

## 2023-02-17 MED ORDER — EPHEDRINE 5 MG/ML INJ
INTRAVENOUS | Status: AC
  Filled 2023-02-17: qty 5

## 2023-02-17 MED ORDER — DEXAMETHASONE SODIUM PHOSPHATE 10 MG/ML IJ SOLN
INTRAMUSCULAR | Status: AC
  Filled 2023-02-17: qty 1

## 2023-02-17 MED ORDER — OXYCODONE HCL 5 MG PO TABS
5.0000 mg | ORAL_TABLET | Freq: Once | ORAL | Status: AC | PRN
  Administered 2023-02-17: 5 mg via ORAL

## 2023-02-17 MED ORDER — FENTANYL CITRATE (PF) 250 MCG/5ML IJ SOLN
INTRAMUSCULAR | Status: AC
  Filled 2023-02-17: qty 5

## 2023-02-17 MED ORDER — PHENYLEPHRINE 80 MCG/ML (10ML) SYRINGE FOR IV PUSH (FOR BLOOD PRESSURE SUPPORT)
PREFILLED_SYRINGE | INTRAVENOUS | Status: DC | PRN
  Administered 2023-02-17: 80 ug via INTRAVENOUS
  Administered 2023-02-17 (×2): 160 ug via INTRAVENOUS

## 2023-02-17 MED ORDER — FENTANYL CITRATE (PF) 100 MCG/2ML IJ SOLN
INTRAMUSCULAR | Status: AC
  Filled 2023-02-17: qty 2

## 2023-02-17 MED ORDER — MEPERIDINE HCL 25 MG/ML IJ SOLN: 6.2500 mg | INTRAMUSCULAR | Status: DC | PRN

## 2023-02-17 MED ORDER — MIDAZOLAM HCL 2 MG/2ML IJ SOLN
INTRAMUSCULAR | Status: AC
  Filled 2023-02-17: qty 2

## 2023-02-17 MED ORDER — ACETAMINOPHEN 500 MG PO TABS
1000.0000 mg | ORAL_TABLET | ORAL | Status: AC
  Administered 2023-02-17: 1000 mg via ORAL
  Filled 2023-02-17: qty 2

## 2023-02-17 MED ORDER — PROPOFOL 10 MG/ML IV BOLUS
INTRAVENOUS | Status: DC | PRN
  Administered 2023-02-17: 170 mg via INTRAVENOUS
  Administered 2023-02-17: 30 mg via INTRAVENOUS

## 2023-02-17 MED ORDER — ONDANSETRON HCL 4 MG/2ML IJ SOLN: 4.0000 mg | Freq: Once | INTRAMUSCULAR | Status: DC | PRN

## 2023-02-17 MED ORDER — LIDOCAINE 2% (20 MG/ML) 5 ML SYRINGE
INTRAMUSCULAR | Status: AC
  Filled 2023-02-17: qty 5

## 2023-02-17 MED ORDER — LACTATED RINGERS IV SOLN: INTRAVENOUS | Status: DC

## 2023-02-17 MED ORDER — FENTANYL CITRATE (PF) 100 MCG/2ML IJ SOLN
25.0000 ug | INTRAMUSCULAR | Status: DC | PRN
  Administered 2023-02-17: 25 ug via INTRAVENOUS

## 2023-02-17 MED ORDER — FENTANYL CITRATE (PF) 250 MCG/5ML IJ SOLN
INTRAMUSCULAR | Status: DC | PRN
  Administered 2023-02-17: 50 ug via INTRAVENOUS

## 2023-02-17 MED ORDER — ONDANSETRON HCL 4 MG/2ML IJ SOLN
INTRAMUSCULAR | Status: AC
  Filled 2023-02-17: qty 2

## 2023-02-17 SURGICAL SUPPLY — 43 items
APL PRP STRL LF DISP 70% ISPRP (MISCELLANEOUS) ×1
APL SKNCLS STERI-STRIP NONHPOA (GAUZE/BANDAGES/DRESSINGS) ×1
APPLIER CLIP 9.375 MED OPEN (MISCELLANEOUS) ×1
APR CLP MED 9.3 20 MLT OPN (MISCELLANEOUS) ×1
BAG COUNTER SPONGE SURGICOUNT (BAG) ×1 IMPLANT
BAG SPNG CNTER NS LX DISP (BAG) ×1
BENZOIN TINCTURE PRP APPL 2/3 (GAUZE/BANDAGES/DRESSINGS) ×1 IMPLANT
BINDER BREAST LRG (GAUZE/BANDAGES/DRESSINGS) IMPLANT
BINDER BREAST XLRG (GAUZE/BANDAGES/DRESSINGS) IMPLANT
CANISTER SUCT 3000ML PPV (MISCELLANEOUS) ×1 IMPLANT
CHLORAPREP W/TINT 26 (MISCELLANEOUS) ×1 IMPLANT
CLIP APPLIE 9.375 MED OPEN (MISCELLANEOUS) IMPLANT
COVER PROBE W GEL 5X96 (DRAPES) ×1 IMPLANT
COVER SURGICAL LIGHT HANDLE (MISCELLANEOUS) ×1 IMPLANT
DEVICE DUBIN SPECIMEN MAMMOGRA (MISCELLANEOUS) ×1 IMPLANT
DRAPE CHEST BREAST 15X10 FENES (DRAPES) ×1 IMPLANT
DRSG TEGADERM 4X4.75 (GAUZE/BANDAGES/DRESSINGS) ×1 IMPLANT
ELECT CAUTERY BLADE 6.4 (BLADE) ×1 IMPLANT
ELECT REM PT RETURN 9FT ADLT (ELECTROSURGICAL) ×1
ELECTRODE REM PT RTRN 9FT ADLT (ELECTROSURGICAL) ×1 IMPLANT
GAUZE SPONGE 2X2 8PLY STRL LF (GAUZE/BANDAGES/DRESSINGS) ×1 IMPLANT
GAUZE SPONGE 2X2 STRL 8-PLY (GAUZE/BANDAGES/DRESSINGS) IMPLANT
GLOVE BIO SURGEON STRL SZ7 (GLOVE) ×1 IMPLANT
GLOVE BIOGEL PI IND STRL 7.5 (GLOVE) ×1 IMPLANT
GOWN STRL REUS W/ TWL LRG LVL3 (GOWN DISPOSABLE) ×2 IMPLANT
GOWN STRL REUS W/TWL LRG LVL3 (GOWN DISPOSABLE) ×2
ILLUMINATOR WAVEGUIDE N/F (MISCELLANEOUS) IMPLANT
KIT BASIN OR (CUSTOM PROCEDURE TRAY) ×1 IMPLANT
KIT MARKER MARGIN INK (KITS) ×1 IMPLANT
LIGHT WAVEGUIDE WIDE FLAT (MISCELLANEOUS) IMPLANT
NDL HYPO 25GX1X1/2 BEV (NEEDLE) ×1 IMPLANT
NEEDLE HYPO 25GX1X1/2 BEV (NEEDLE) ×1 IMPLANT
NS IRRIG 1000ML POUR BTL (IV SOLUTION) ×1 IMPLANT
PACK GENERAL/GYN (CUSTOM PROCEDURE TRAY) ×1 IMPLANT
SPONGE T-LAP 4X18 ~~LOC~~+RFID (SPONGE) ×1 IMPLANT
STRIP CLOSURE SKIN 1/2X4 (GAUZE/BANDAGES/DRESSINGS) ×1 IMPLANT
SUT MNCRL AB 4-0 PS2 18 (SUTURE) ×1 IMPLANT
SUT SILK 2 0 SH (SUTURE) IMPLANT
SUT VIC AB 3-0 SH 27 (SUTURE) ×1
SUT VIC AB 3-0 SH 27X BRD (SUTURE) ×1 IMPLANT
SYR CONTROL 10ML LL (SYRINGE) ×1 IMPLANT
TOWEL GREEN STERILE (TOWEL DISPOSABLE) ×1 IMPLANT
TOWEL GREEN STERILE FF (TOWEL DISPOSABLE) ×1 IMPLANT

## 2023-02-17 NOTE — Interval H&P Note (Signed)
History and Physical Interval Note:  02/17/2023 8:16 AM  Kaitlyn Ramsey  has presented today for surgery, with the diagnosis of LEFT BREAST DUCTAL CARCINOMA IN SITU.  The various methods of treatment have been discussed with the patient and family. After consideration of risks, benefits and other options for treatment, the patient has consented to  Procedure(s) with comments: LEFT BREAST LUMPECTOMY WITH RADIOACTIVE SEED LOCALIZATION (Left) - LMA as a surgical intervention.  The patient's history has been reviewed, patient examined, no change in status, stable for surgery.  I have reviewed the patient's chart and labs.  Questions were answered to the patient's satisfaction.     Wynona Luna

## 2023-02-17 NOTE — Anesthesia Postprocedure Evaluation (Signed)
Anesthesia Post Note  Patient: Kaitlyn Ramsey  Procedure(s) Performed: LEFT BREAST LUMPECTOMY WITH RADIOACTIVE SEED LOCALIZATION (Left: Breast)     Patient location during evaluation: PACU Anesthesia Type: General Level of consciousness: awake and alert Pain management: pain level controlled Vital Signs Assessment: post-procedure vital signs reviewed and stable Respiratory status: spontaneous breathing, nonlabored ventilation, respiratory function stable and patient connected to nasal cannula oxygen Cardiovascular status: blood pressure returned to baseline and stable Postop Assessment: no apparent nausea or vomiting Anesthetic complications: no   No notable events documented.  Last Vitals:  Vitals:   02/17/23 1130 02/17/23 1145  BP: (!) 148/75 (!) 144/64  Pulse: 76 67  Resp: 14 (!) 9  Temp:    SpO2: 94% 94%    Last Pain:  Vitals:   02/17/23 1135  TempSrc:   PainSc: 6                  Lua Feng

## 2023-02-17 NOTE — Transfer of Care (Signed)
Immediate Anesthesia Transfer of Care Note  Patient: Kaitlyn Ramsey  Procedure(s) Performed: LEFT BREAST LUMPECTOMY WITH RADIOACTIVE SEED LOCALIZATION (Left: Breast)  Patient Location: PACU  Anesthesia Type:General  Level of Consciousness: awake, alert , and drowsy  Airway & Oxygen Therapy: Patient Spontanous Breathing  Post-op Assessment: Report given to RN, Post -op Vital signs reviewed and stable, and Patient moving all extremities X 4  Post vital signs: Reviewed and stable  Last Vitals:  Vitals Value Taken Time  BP 148/75 02/17/23 1130  Temp    Pulse 74 02/17/23 1131  Resp 15 02/17/23 1131  SpO2 96 % 02/17/23 1131  Vitals shown include unvalidated device data.  Last Pain:  Vitals:   02/17/23 1115  TempSrc:   PainSc: 8       Patients Stated Pain Goal: 4 (02/17/23 1115)  Complications: No notable events documented.

## 2023-02-17 NOTE — Anesthesia Procedure Notes (Signed)
Procedure Name: LMA Insertion Date/Time: 02/17/2023 10:24 AM  Performed by: Aundria Rud, CRNAPre-anesthesia Checklist: Patient identified, Patient being monitored, Timeout performed, Emergency Drugs available and Suction available Patient Re-evaluated:Patient Re-evaluated prior to induction Oxygen Delivery Method: Circle system utilized Preoxygenation: Pre-oxygenation with 100% oxygen Induction Type: IV induction Ventilation: Mask ventilation without difficulty LMA: LMA inserted LMA Size: 4.0 Tube type: Oral Number of attempts: 1 Placement Confirmation: positive ETCO2 and breath sounds checked- equal and bilateral Tube secured with: Tape Dental Injury: Teeth and Oropharynx as per pre-operative assessment

## 2023-02-17 NOTE — Op Note (Signed)
Pre-op Diagnosis:  Left breast ductal carcinoma in situ Post-op Diagnosis: same Procedure:  Left radioactive seed localized lumpectomy Surgeon:  Delta Deshmukh K. Anesthesia:  GEN - LMA Indications:  This is a 67 year old female who recently underwent screening mammogram. This shows some asymmetry in the left breast. She underwent diagnostic mammogram and ultrasound. This showed a 1.0 x 1.0 x 0.4 cm longitudinal mass located in the left breast at 2:00, 2 cm from the nipple. She underwent biopsy of this area that revealed ductal carcinoma in situ grade 2, ER/PR positive. The patient has no family history of breast cancer.   She was evaluated and we recommended lumpectomy.  Description of procedure: The patient is brought to the operating room placed in supine position on the operating room table. After an adequate level of general anesthesia was obtained, her left breast was prepped with ChloraPrep and draped in sterile fashion. A timeout was taken to ensure the proper patient and proper procedure. We interrogated the breast with the neoprobe. We made a circumareolar incision around the lateral side of the nipple after infiltrating with 0.25% Marcaine. Dissection was carried down in the breast tissue with cautery. We used the neoprobe to guide [place] towards the radioactive seed. We excised an area of tissue around the radioactive seed 2.5 cm in diameter. The specimen was removed and was oriented with a paint kit. Specimen mammogram showed the radioactive seed as well as the biopsy clip within the specimen. This was sent for pathologic examination. There is no residual radioactivity within the biopsy cavity. We inspected carefully for hemostasis. The wound was thoroughly irrigated. We placed clips in all five margins.  The wound was closed with a deep layer of 3-0 Vicryl and a subcuticular layer of 4-0 Monocryl. Benzoin Steri-Strips were applied. The patient was then extubated and brought to the recovery room in  stable condition. All sponge, instrument, and needle counts are correct.  Wilmon Arms. Corliss Skains, MD, [facility] Surgery  General/ Trauma Surgery  02/17/2023 11:03 AM

## 2023-02-17 NOTE — Discharge Instructions (Signed)
[facility] Office Phone Number [phone]  BREAST BIOPSY/ PARTIAL MASTECTOMY: POST OP INSTRUCTIONS  Always review your discharge instruction sheet given to you by the facility where your surgery was performed.  IF YOU HAVE DISABILITY OR FAMILY LEAVE FORMS, YOU MUST BRING THEM TO THE OFFICE FOR PROCESSING.  DO NOT GIVE THEM TO YOUR DOCTOR.  A prescription for pain medication may be given to you upon discharge.  Take your pain medication as prescribed, if needed.  If narcotic pain medicine is not needed, then you may take acetaminophen (Tylenol) or ibuprofen (Advil) as needed. Take your usually prescribed medications unless otherwise directed If you need a refill on your pain medication, please contact your pharmacy.  They will contact our office to request authorization.  Prescriptions will not be filled after 5pm or on week-ends. You should eat very light the first 24 hours after surgery, such as soup, crackers, pudding, etc.  Resume your normal diet the day after surgery. Most patients will experience some swelling and bruising in the breast.  Ice packs and a good support bra will help.  Swelling and bruising can take several days to resolve.  It is common to experience some constipation if taking pain medication after surgery.  Increasing fluid intake and taking a stool softener will usually help or prevent this problem from occurring.  A mild laxative (Milk of Magnesia or Miralax) should be taken according to package directions if there are no bowel movements after 48 hours. Unless discharge instructions indicate otherwise, you may remove your bandages 24-48 hours after surgery, and you may shower at that time.  You may have steri-strips (small skin tapes) in place directly over the incision.  These strips should be left on the skin for 7-10 days.  If your surgeon used skin glue on the incision, you may shower in 24 hours.  The glue will flake off over the next 2-3 weeks.  Any  sutures or staples will be removed at the office during your follow-up visit. ACTIVITIES:  You may resume regular daily activities (gradually increasing) beginning the next day.  Wearing a good support bra or sports bra minimizes pain and swelling.  You may have sexual intercourse when it is comfortable. You may drive when you no longer are taking prescription pain medication, you can comfortably wear a seatbelt, and you can safely maneuver your car and apply brakes. RETURN TO WORK:  ______________________________________________________________________________________ You should see your doctor in the office for a follow-up appointment approximately two weeks after your surgery.  Your doctor's nurse will typically make your follow-up appointment when she calls you with your pathology report.  Expect your pathology report 2-3 business days after your surgery.  You may call to check if you do not hear from us after three days. OTHER INSTRUCTIONS: _______________________________________________________________________________________________ _____________________________________________________________________________________________________________________________________ _____________________________________________________________________________________________________________________________________ _____________________________________________________________________________________________________________________________________  WHEN TO CALL YOUR DOCTOR: Fever over 101.0 Nausea and/or vomiting. Extreme swelling or bruising. Continued bleeding from incision. Increased pain, redness, or drainage from the incision.  The clinic staff is available to answer your questions during regular business hours.  Please don't hesitate to call and ask to speak to one of the nurses for clinical concerns.  If you have a medical emergency, go to the nearest emergency room or call 911.  A surgeon from [facility] is always on call at the hospital.  For further questions, please visit [email or web address]   

## 2023-02-18 ENCOUNTER — Encounter (HOSPITAL_COMMUNITY): Payer: Self-pay | Admitting: Surgery

## 2023-02-24 ENCOUNTER — Encounter: Payer: Self-pay | Admitting: *Deleted

## 2023-02-24 LAB — SURGICAL PATHOLOGY

## 2023-03-05 NOTE — Progress Notes (Signed)
Location of Breast Cancer: Ductal carcinoma in situ (DCIS) of left breast   Histology per Pathology Report:  02-17-23 FINAL MICROSCOPIC DIAGNOSIS:  A. BREAST, LEFT, LUMPECTOMY: - Ductal carcinoma in situ, cribriform type, involving an intraductal papilloma, nuclear grade 2 of 3 - Calcifications: Not identified - Necrosis: Not identified - Margins negative for DCIS   - Closest margins: Inferior, 1.5 mm; Superior and Posterior, 1.8 mm (see below) - Prognostic markers:  ER positive (100%), PR positive (95%) - Other: Fibrosis, focal usual ductal hyperplasia (UDH), biopsy site changes - Negative for invasive carcinoma - See oncology table and comment  DCIS OF THE BREAST:  Resection Procedure: Lumpectomy Specimen Laterality: Left Histologic Type: Ductal carcinoma in situ Size of DCIS: 1.9 cm Nuclear Grade: 2 Necrosis: Not identified Margins: All margins negative for DCIS      Specify Closest Margin (required only if <59mm): Inferior, 1.5 mm; Superior and Posterior 1.8 mm; Medial, 6.0 mm; Anterior, 9.0 mm Regional Lymph Nodes: Not applicable (no lymph nodes submitted or found)      Number of Lymph Nodes Examined: 0 Breast Biomarker Testing Performed on Previous Biopsy:      Testing performed on Case Number: [id number]      Estrogen Receptor: 100%, positive, strong staining intensity      Progesterone Receptor: 95%, positive, strong staining intensity Pathologic Stage Classification (pTNM, AJCC 8th Edition): pTis, pNx Representative Tumor Block: A1-A3 Comment(s): Immunohistochemical staining for myoepithelial markers (calponin, smooth muscle myosin and p63) are performed on blocks A1 and A3.  Myoepithelial markers are retained which is consistent with the above diagnosis. Immunohistochemical staining for CK5/6 and ER on blocks A1 and A3 support the above diagnosis.  Immunostains for synaptophysin and chromogranin are negative (A3).  Kaitlyn Ramsey reviewed the case and agrees with the  above diagnosis. (v4.4.0.0)  Receptor Status: ER(100%), PR (95%), Her2-neu (), Ki-67()  Did patient present with symptoms (if so, please note symptoms) or was this found on screening mammography?: screening mammogram  Past/Anticipated interventions by surgeon, if any: 02/17/2023  Procedure:  Left radioactive seed localized lumpectomy Surgeon:  Kaitlyn Ramsey,Kaitlyn Ramsey.  Past/Anticipated interventions by medical oncology, if any:   Kaitlyn Moulds, MD 01/27/2023   Oncology History  Ductal carcinoma in situ (DCIS) of left breast  12/30/2022 Mammogram    Bilateral screening mammogram showed a possible asymmetry in the left breast.  Diagnostic mammogram showed suspicious 1 cm mass in the left breast at 2:00 2 cm from the nipple, lymph node with borderline cortical thickening in the left axilla    01/15/2023 Pathology Results    Left breast needle core biopsy at 2:00 showed DCIS, intermediate nuclear grade, cribriform type with necrosis, negative for invasive carcinoma.  Lymph node was benign and reactive.  Prognostic showed ER positive at 100% strong staining PR 95% positive strong staining    01/26/2023 Initial Diagnosis    Ductal carcinoma in situ (DCIS) of left breast  ASSESSMENT AND PLAN:  Ductal carcinoma in situ (DCIS) of left breast This is a very pleasant 67 yr old female patient with new diagnosis of left breast DCIS, ER PR positive referred to breast [facility] for recommendations. We have discussed the following findings about DCIS.   Pathology review: I discussed with the patient the difference between DCIS and invasive breast cancer. It is considered a precancerous lesion. DCIS is classified as a Stage 0 breast cancer. It is generally detected through mammograms as calcifications. We discussed the significance of grades and its impact on prognosis. We also  discussed the importance of ER and PR receptors and their implications to adjuvant treatment options. Prognosis of DCIS dependence on grade and  degree of comedo necrosis. It is anticipated that if not treated, 20-30% of DCIS can develop into invasive breast cancer.   Recommendation: 1. Breast conserving surgery 2. Followed by adjuvant radiation therapy 3. Followed by antiestrogen therapy with tamoxifen/aromatase inhibitors based on menopausal status 5 years   Tamoxifen counseling: We discussed the risks and benefits of tamoxifen. These include but not limited to insomnia, hot flashes, mood changes, vaginal dryness, and weight gain. Although rare, serious side effects including endometrial cancer, risk of blood clots were also discussed. We strongly believe that the benefits far outweigh the risks. Patient understands these risks and consented to starting treatment. Planned treatment duration is 5 years.   Aromatase inhibitors counseling: We have discussed the mechanism of action of aromatase inhibitors today.  We have discussed adverse effects including but not limited to menopausal symptoms, increased risk of osteoporosis and fractures, cardiovascular events, arthralgias and myalgias.  We do believe that the benefits far outweigh the risks.  Plan treatment duration of 5 years.   She will return to clinic after completion of radiation to initiate antiestrogen therapy.  She has some situational anxiety and request a short prescription of Xanax.  She understands that we cannot refill this medication long term.  Lymphedema issues, if any:  none to report  Pain issues, if any: reports some breast tenderness and some sharp shooting pains in breast at times, pt also has chronic back pain  SAFETY ISSUES: Prior radiation? no Pacemaker/ICD? no Possible current pregnancy?no Is the patient on methotrexate? no  Current Complaints / other details:  Pt feeling down with diagnosis overall. She feels like this contributes to her fatigue. Overall not major questions or concerns going into consult tomorrow with Dr. Roselind Ramsey.

## 2023-03-16 ENCOUNTER — Encounter: Payer: Self-pay | Admitting: General Practice

## 2023-03-16 ENCOUNTER — Encounter: Payer: Self-pay | Admitting: Radiation Oncology

## 2023-03-16 ENCOUNTER — Telehealth: Payer: Self-pay

## 2023-03-16 NOTE — Progress Notes (Signed)
Radiation Oncology         [phone] ________________________________  Name: Kaitlyn Ramsey MRN: [id number]  Date: 03/17/2023  DOB: 1955-09-04  Re-Evaluation Note  CC: Renaye Rakers, MD  Rachel Moulds, MD  No diagnosis found.  Diagnosis:   Stage 0 (cTis (DCIS), cN0, cM0) Left Breast, Intermediate grade DCIS involving an intraductal papilloma, ER+ / PR+ / Her2 not assessed: s/p lumpectomy   Narrative:  The patient returns today to discuss radiation treatment options. She was seen in the multidisciplinary breast clinic on 01/27/23.   She opted to proceed with a left breast lumpectomy without nodal biopsies on 02/17/23 under the care of Dr. Corliss Skains. Pathology from the procedure revealed: intermediate grade DCIS measuring 1.9 cm involving an intraductal papilloma; all margins negative for DCIS. Prognostic indicators significant for: estrogen receptor 100% positive and progesterone receptor 95% positive, both with strong staining intensity; Her2 not assessed.   Based on her discussion with Dr. Al Pimple during her breast clinic evaluation, the patient is interested in proceeding with antiestrogen therapy consisting of tamoxifen/aromatase inhibitors (based on her menopausal status) 5 years.   On review of systems, the patient reports ***. She denies *** and any other symptoms.    Allergies:  is allergic to penicillins.  Meds: Current Outpatient Medications  Medication Sig Dispense Refill   amLODipine (NORVASC) 10 MG tablet Take 10 mg by mouth at bedtime.     atorvastatin (LIPITOR) 20 MG tablet Take 20 mg by mouth daily.     Biotin 86578 MCG TABS Take 10,000 mcg by mouth daily.     Calcium Citrate-Vitamin D (CALCIUM + D PO) Take 1 tablet by mouth daily.     Cholecalciferol (VITAMIN D3 MAXIMUM STRENGTH) 125 MCG (5000 UT) capsule Take 5,000 Units by mouth daily.     gabapentin (NEURONTIN) 600 MG tablet Take 600 mg by mouth 3 (three) times daily.     metoprolol tartrate (LOPRESSOR) 25  MG tablet Take 25 mg by mouth 2 (two) times daily.     Milk Thistle 1000 MG CAPS Take 1,000 mg by mouth daily.     Multiple Vitamin (MULTIVITAMIN WITH MINERALS) TABS tablet Take 1 tablet by mouth daily.     oxyCODONE-acetaminophen (PERCOCET) 10-325 MG tablet Take 1 tablet by mouth every 6 (six) hours as needed for pain.     Semaglutide, 2 MG/DOSE, (OZEMPIC, 2 MG/DOSE,) 8 MG/3ML SOPN Take 2 mg by mouth every Tuesday.     valsartan-hydrochlorothiazide (DIOVAN-HCT) 160-25 MG tablet Take 1 tablet by mouth daily.     ALPRAZolam (XANAX) 0.5 MG tablet Take 1 tablet (0.5 mg total) by mouth daily as needed for anxiety. 10 tablet 0   No current facility-administered medications for this encounter.    Physical Findings: The patient is in no acute distress. Patient is alert and oriented.  vitals were not taken for this visit.  No significant changes. Lungs are clear to auscultation bilaterally. Heart has regular rate and rhythm. No palpable cervical, supraclavicular, or axillary adenopathy. Abdomen soft, non-tender, normal bowel sounds. Right Breast: no palpable mass, nipple discharge or bleeding. Left Breast: ***  Lab Findings: Lab Results  Component Value Date   WBC 6.3 01/27/2023   HGB 14.5 01/27/2023   HCT 39.5 01/27/2023   MCV 81.4 01/27/2023   PLT 278 01/27/2023    Radiographic Findings: MM Breast Surgical Specimen  Result Date: 02/17/2023 CLINICAL DATA:  Status post seed localization prior to lumpectomy for recently diagnosed ductal carcinoma in situ papillary lesion. EXAM:  SPECIMEN RADIOGRAPH OF THE LEFT BREAST COMPARISON:  Previous exam(s). FINDINGS: Status post excision of the left breast. The radioactive seed and Venus biopsy marker clip are present, completely intact, and were marked for pathology. Findings are discussed with the operating room nurse at the time interpretation. IMPRESSION: Specimen radiograph of the left breast. Electronically Signed   By: Norva Pavlov M.D.   On:  02/17/2023 10:56  MM LT RADIOACTIVE SEED LOC MAMMO GUIDE  Result Date: 02/16/2023 CLINICAL DATA:  Patient presents for seed localization prior to LEFT lumpectomy for recently diagnosed ductal carcinoma in situ involving a fragmented papillary lesion. EXAM: MAMMOGRAPHIC GUIDED RADIOACTIVE SEED LOCALIZATION OF THE LEFT BREAST COMPARISON:  Previous exam(s). FINDINGS: Patient presents for radioactive seed localization prior to lumpectomy. I met with the patient and we discussed the procedure of seed localization including benefits and alternatives. We discussed the high likelihood of a successful procedure. We discussed the risks of the procedure including infection, bleeding, tissue injury and further surgery. We discussed the low dose of radioactivity involved in the procedure. Informed, written consent was given. The usual time-out protocol was performed immediately prior to the procedure. Using mammographic guidance, sterile technique, 1% lidocaine and an I-125 radioactive seed, the Venus shaped clip in the LATERAL portion of the LEFT breast was localized using a LATERAL approach. The follow-up mammogram images confirm the seed in the expected location and were marked for Dr. Corliss Skains. Follow-up survey of the patient confirms presence of the radioactive seed. Order number of I-125 seed:  [id number]. Total activity:  0.264 millicuries reference Date: 02/04/2023 The patient tolerated the procedure well and was released from the [facility]. She was given instructions regarding seed removal. IMPRESSION: Radioactive seed localization LEFT breast. No apparent complications. Electronically Signed   By: Norva Pavlov M.D.   On: 02/16/2023 16:01   Impression: Stage 0 (cTis (DCIS), cN0, cM0) Left Breast, Intermediate grade DCIS involving an intraductal papilloma, ER+ / PR+ / Her2 not assessed: s/p lumpectomy   ***  Plan:  Patient is scheduled for CT simulation {date/later today}.  ***  -----------------------------------  Billie Lade, PhD, MD  This document serves as a record of services personally performed by Antony Blackbird, MD. It was created on his behalf by Neena Rhymes, a trained medical scribe. The creation of this record is based on the scribe's personal observations and the provider's statements to them. This document has been checked and approved by the attending provider.

## 2023-03-16 NOTE — Telephone Encounter (Signed)
RN called pt for meaningful use and nurse evaluation information. Consult note completed and routed to Dr. Roselind Messier.

## 2023-03-16 NOTE — Progress Notes (Signed)
[facility] Spiritual Care Note  Reached out to Kaitlyn Ramsey by phone per referral from nursing for additional layer of support. She reports that she experiences some feelings of isolation because people close to her do not understand what she is going through with diagnosis and treatment; at the same time, she values her privacy and solitude. Kaitlyn Ramsey notes that she finds solace in nature and has [place] trips to the [place] and YouTube videos for getting herself into a more peaceful headspace.  Provided introduction to Spiritual Care and [employer] support programming, including support groups and peer mentors, as well as empathic listening, normalization of feelings, and emotional support. Kaitlyn Ramsey has direct Spiritual Care number and plans to call as needed/desired for follow-up support.   [address] [place], [place], [facility] Pager [phone] Voicemail [phone]

## 2023-03-17 ENCOUNTER — Ambulatory Visit
Admission: RE | Admit: 2023-03-17 | Discharge: 2023-03-17 | Disposition: A | Discharge status: Home / Self Care | Payer: Medicare HMO | Source: Ambulatory Visit | Attending: Radiation Oncology | Admitting: Radiation Oncology

## 2023-03-17 ENCOUNTER — Other Ambulatory Visit: Payer: Self-pay

## 2023-03-17 VITALS — BP 126/77 | HR 78 | Temp 97.5°F | Resp 20 | Ht 66.0 in | Wt 213.0 lb

## 2023-03-17 DIAGNOSIS — Z170 Estrogen receptor positive status [ER+]: Secondary | ICD-10-CM | POA: Insufficient documentation

## 2023-03-17 DIAGNOSIS — Z510 Encounter for antineoplastic radiation therapy: Secondary | ICD-10-CM | POA: Diagnosis not present

## 2023-03-17 DIAGNOSIS — D0512 Intraductal carcinoma in situ of left breast: Secondary | ICD-10-CM | POA: Insufficient documentation

## 2023-03-17 DIAGNOSIS — Z79899 Other long term (current) drug therapy: Secondary | ICD-10-CM | POA: Insufficient documentation

## 2023-03-23 DIAGNOSIS — Z510 Encounter for antineoplastic radiation therapy: Secondary | ICD-10-CM | POA: Diagnosis not present

## 2023-03-24 ENCOUNTER — Encounter: Payer: Self-pay | Admitting: *Deleted

## 2023-03-24 DIAGNOSIS — D0512 Intraductal carcinoma in situ of left breast: Secondary | ICD-10-CM

## 2023-03-25 ENCOUNTER — Telehealth: Payer: Self-pay | Admitting: Hematology and Oncology

## 2023-03-25 NOTE — Telephone Encounter (Signed)
Patient is aware of follow up appointment times/dates after treatment.

## 2023-03-29 ENCOUNTER — Ambulatory Visit: Payer: Medicare HMO

## 2023-03-29 ENCOUNTER — Ambulatory Visit: Payer: Medicare HMO | Admitting: Radiation Oncology

## 2023-03-30 ENCOUNTER — Ambulatory Visit: Payer: Medicare HMO

## 2023-03-31 ENCOUNTER — Ambulatory Visit: Payer: Medicare HMO

## 2023-03-31 ENCOUNTER — Other Ambulatory Visit: Payer: Self-pay

## 2023-03-31 ENCOUNTER — Ambulatory Visit
Admission: RE | Admit: 2023-03-31 | Discharge: 2023-03-31 | Disposition: A | Discharge status: Home / Self Care | Payer: Medicare HMO | Source: Ambulatory Visit | Attending: Radiation Oncology | Admitting: Radiation Oncology

## 2023-03-31 DIAGNOSIS — D0512 Intraductal carcinoma in situ of left breast: Secondary | ICD-10-CM

## 2023-03-31 DIAGNOSIS — Z510 Encounter for antineoplastic radiation therapy: Secondary | ICD-10-CM | POA: Diagnosis not present

## 2023-03-31 LAB — RAD ONC ARIA SESSION SUMMARY
Course Elapsed Days: 0
Plan Fractions Treated to Date: 1
Plan Prescribed Dose Per Fraction: 2.67 Gy
Plan Total Fractions Prescribed: 15
Plan Total Prescribed Dose: 40.05 Gy
Reference Point Dosage Given to Date: 2.67 Gy
Reference Point Session Dosage Given: 2.67 Gy
Session Number: 1

## 2023-04-01 ENCOUNTER — Ambulatory Visit
Admission: RE | Admit: 2023-04-01 | Discharge: 2023-04-01 | Disposition: A | Discharge status: Home / Self Care | Payer: Medicare HMO | Source: Ambulatory Visit | Attending: Radiation Oncology | Admitting: Radiation Oncology

## 2023-04-01 ENCOUNTER — Other Ambulatory Visit: Payer: Self-pay

## 2023-04-01 ENCOUNTER — Ambulatory Visit: Payer: Medicare HMO

## 2023-04-01 DIAGNOSIS — D0512 Intraductal carcinoma in situ of left breast: Secondary | ICD-10-CM | POA: Insufficient documentation

## 2023-04-01 LAB — RAD ONC ARIA SESSION SUMMARY
Course Elapsed Days: 1
Plan Fractions Treated to Date: 2
Plan Prescribed Dose Per Fraction: 2.67 Gy
Plan Total Fractions Prescribed: 15
Plan Total Prescribed Dose: 40.05 Gy
Reference Point Dosage Given to Date: 5.34 Gy
Reference Point Session Dosage Given: 2.67 Gy
Session Number: 2

## 2023-04-02 ENCOUNTER — Ambulatory Visit: Admission: RE | Admit: 2023-04-02 | Payer: Medicare HMO | Source: Ambulatory Visit

## 2023-04-02 ENCOUNTER — Ambulatory Visit: Payer: Medicare HMO

## 2023-04-02 ENCOUNTER — Other Ambulatory Visit: Payer: Self-pay

## 2023-04-02 DIAGNOSIS — D0512 Intraductal carcinoma in situ of left breast: Secondary | ICD-10-CM | POA: Diagnosis not present

## 2023-04-02 LAB — RAD ONC ARIA SESSION SUMMARY
Course Elapsed Days: 2
Plan Fractions Treated to Date: 3
Plan Prescribed Dose Per Fraction: 2.67 Gy
Plan Total Fractions Prescribed: 15
Plan Total Prescribed Dose: 40.05 Gy
Reference Point Dosage Given to Date: 8.01 Gy
Reference Point Session Dosage Given: 2.67 Gy
Session Number: 3

## 2023-04-05 ENCOUNTER — Ambulatory Visit: Admission: RE | Admit: 2023-04-05 | Payer: Medicare HMO | Source: Ambulatory Visit

## 2023-04-05 ENCOUNTER — Other Ambulatory Visit: Payer: Self-pay

## 2023-04-05 ENCOUNTER — Ambulatory Visit: Payer: Medicare HMO

## 2023-04-05 DIAGNOSIS — D0512 Intraductal carcinoma in situ of left breast: Secondary | ICD-10-CM | POA: Diagnosis not present

## 2023-04-05 LAB — RAD ONC ARIA SESSION SUMMARY
Course Elapsed Days: 5
Plan Fractions Treated to Date: 4
Plan Prescribed Dose Per Fraction: 2.67 Gy
Plan Total Fractions Prescribed: 15
Plan Total Prescribed Dose: 40.05 Gy
Reference Point Dosage Given to Date: 10.68 Gy
Reference Point Session Dosage Given: 2.67 Gy
Session Number: 4

## 2023-04-06 ENCOUNTER — Other Ambulatory Visit: Payer: Self-pay

## 2023-04-06 ENCOUNTER — Ambulatory Visit: Payer: Medicare HMO

## 2023-04-06 ENCOUNTER — Ambulatory Visit
Admission: RE | Admit: 2023-04-06 | Discharge: 2023-04-06 | Disposition: A | Discharge status: Home / Self Care | Payer: Medicare HMO | Source: Ambulatory Visit | Attending: Radiation Oncology | Admitting: Radiation Oncology

## 2023-04-06 DIAGNOSIS — D0512 Intraductal carcinoma in situ of left breast: Secondary | ICD-10-CM | POA: Diagnosis not present

## 2023-04-06 LAB — RAD ONC ARIA SESSION SUMMARY
Course Elapsed Days: 6
Plan Fractions Treated to Date: 5
Plan Prescribed Dose Per Fraction: 2.67 Gy
Plan Total Fractions Prescribed: 15
Plan Total Prescribed Dose: 40.05 Gy
Reference Point Dosage Given to Date: 13.35 Gy
Reference Point Session Dosage Given: 2.67 Gy
Session Number: 5

## 2023-04-06 MED ORDER — RADIAPLEXRX EX GEL: Freq: Once | CUTANEOUS | Status: AC

## 2023-04-06 MED ORDER — ALRA NON-METALLIC DEODORANT (RAD-ONC)
1.0000 | Freq: Once | TOPICAL | Status: AC
  Administered 2023-04-06: 1 via TOPICAL

## 2023-04-07 ENCOUNTER — Other Ambulatory Visit: Payer: Self-pay

## 2023-04-07 ENCOUNTER — Ambulatory Visit: Payer: Medicare HMO

## 2023-04-07 ENCOUNTER — Ambulatory Visit
Admission: RE | Admit: 2023-04-07 | Discharge: 2023-04-07 | Disposition: A | Discharge status: Home / Self Care | Payer: Medicare HMO | Source: Ambulatory Visit | Attending: Radiation Oncology | Admitting: Radiation Oncology

## 2023-04-07 DIAGNOSIS — D0512 Intraductal carcinoma in situ of left breast: Secondary | ICD-10-CM | POA: Diagnosis not present

## 2023-04-07 LAB — RAD ONC ARIA SESSION SUMMARY
Course Elapsed Days: 7
Plan Fractions Treated to Date: 6
Plan Prescribed Dose Per Fraction: 2.67 Gy
Plan Total Fractions Prescribed: 15
Plan Total Prescribed Dose: 40.05 Gy
Reference Point Dosage Given to Date: 16.02 Gy
Reference Point Session Dosage Given: 2.67 Gy
Session Number: 6

## 2023-04-08 ENCOUNTER — Ambulatory Visit: Payer: Medicare HMO

## 2023-04-08 ENCOUNTER — Telehealth: Payer: Self-pay | Admitting: Radiation Oncology

## 2023-04-08 DIAGNOSIS — D0512 Intraductal carcinoma in situ of left breast: Secondary | ICD-10-CM | POA: Diagnosis not present

## 2023-04-08 NOTE — Telephone Encounter (Signed)
8/8 @ 8:15 am Patient called to cancel her treatment appointment for today due to the weather.  She stated, she stays in [place] and quiet a distance.  Patient is scheduled on L2 machine @ 2:15 pm.  Email sent to L2 machine and Hansel Starling Virgel Manifold so they are aware.

## 2023-04-09 ENCOUNTER — Ambulatory Visit
Admission: RE | Admit: 2023-04-09 | Discharge: 2023-04-09 | Disposition: A | Discharge status: Home / Self Care | Payer: Medicare HMO | Source: Ambulatory Visit | Attending: Radiation Oncology | Admitting: Radiation Oncology

## 2023-04-09 ENCOUNTER — Other Ambulatory Visit: Payer: Self-pay

## 2023-04-09 ENCOUNTER — Ambulatory Visit: Payer: Medicare HMO

## 2023-04-09 DIAGNOSIS — D0512 Intraductal carcinoma in situ of left breast: Secondary | ICD-10-CM | POA: Diagnosis not present

## 2023-04-09 LAB — RAD ONC ARIA SESSION SUMMARY
Course Elapsed Days: 9
Plan Fractions Treated to Date: 7
Plan Prescribed Dose Per Fraction: 2.67 Gy
Plan Total Fractions Prescribed: 15
Plan Total Prescribed Dose: 40.05 Gy
Reference Point Dosage Given to Date: 18.69 Gy
Reference Point Session Dosage Given: 2.67 Gy
Session Number: 7

## 2023-04-12 ENCOUNTER — Ambulatory Visit: Payer: Medicare HMO

## 2023-04-12 ENCOUNTER — Other Ambulatory Visit: Payer: Self-pay

## 2023-04-12 ENCOUNTER — Ambulatory Visit
Admission: RE | Admit: 2023-04-12 | Discharge: 2023-04-12 | Disposition: A | Discharge status: Home / Self Care | Payer: Medicare HMO | Source: Ambulatory Visit | Attending: Radiation Oncology | Admitting: Radiation Oncology

## 2023-04-12 DIAGNOSIS — D0512 Intraductal carcinoma in situ of left breast: Secondary | ICD-10-CM | POA: Diagnosis not present

## 2023-04-12 LAB — RAD ONC ARIA SESSION SUMMARY
Course Elapsed Days: 12
Plan Fractions Treated to Date: 8
Plan Prescribed Dose Per Fraction: 2.67 Gy
Plan Total Fractions Prescribed: 15
Plan Total Prescribed Dose: 40.05 Gy
Reference Point Dosage Given to Date: 21.36 Gy
Reference Point Session Dosage Given: 2.67 Gy
Session Number: 8

## 2023-04-13 ENCOUNTER — Encounter: Payer: Self-pay | Admitting: Hematology and Oncology

## 2023-04-13 ENCOUNTER — Ambulatory Visit: Payer: Medicare HMO | Admitting: Radiation Oncology

## 2023-04-13 ENCOUNTER — Ambulatory Visit: Payer: Medicare HMO

## 2023-04-13 ENCOUNTER — Other Ambulatory Visit: Payer: Self-pay

## 2023-04-13 ENCOUNTER — Ambulatory Visit
Admission: RE | Admit: 2023-04-13 | Discharge: 2023-04-13 | Disposition: A | Discharge status: Home / Self Care | Payer: Medicare HMO | Source: Ambulatory Visit | Attending: Radiation Oncology | Admitting: Radiation Oncology

## 2023-04-13 DIAGNOSIS — D0512 Intraductal carcinoma in situ of left breast: Secondary | ICD-10-CM | POA: Diagnosis not present

## 2023-04-13 LAB — RAD ONC ARIA SESSION SUMMARY
Course Elapsed Days: 13
Plan Fractions Treated to Date: 9
Plan Prescribed Dose Per Fraction: 2.67 Gy
Plan Total Fractions Prescribed: 15
Plan Total Prescribed Dose: 40.05 Gy
Reference Point Dosage Given to Date: 24.03 Gy
Reference Point Session Dosage Given: 2.67 Gy
Session Number: 9

## 2023-04-13 NOTE — Progress Notes (Signed)
Approved patient for gas card requested by registration.  Called patient to advise what is needed to complete grant process. She states that is too much and she is tired and will bring card back tomorrow. Encouraged patient and advised she can bring documents tomorrow and she states that is still too much and she appreciates the help but will return the card.

## 2023-04-14 ENCOUNTER — Other Ambulatory Visit: Payer: Self-pay

## 2023-04-14 ENCOUNTER — Ambulatory Visit: Payer: Medicare HMO

## 2023-04-14 ENCOUNTER — Ambulatory Visit
Admission: RE | Admit: 2023-04-14 | Discharge: 2023-04-14 | Disposition: A | Discharge status: Home / Self Care | Payer: Medicare HMO | Source: Ambulatory Visit | Attending: Radiation Oncology | Admitting: Radiation Oncology

## 2023-04-14 DIAGNOSIS — D0512 Intraductal carcinoma in situ of left breast: Secondary | ICD-10-CM | POA: Diagnosis not present

## 2023-04-14 LAB — RAD ONC ARIA SESSION SUMMARY
Course Elapsed Days: 14
Plan Fractions Treated to Date: 10
Plan Prescribed Dose Per Fraction: 2.67 Gy
Plan Total Fractions Prescribed: 15
Plan Total Prescribed Dose: 40.05 Gy
Reference Point Dosage Given to Date: 26.7 Gy
Reference Point Session Dosage Given: 2.67 Gy
Session Number: 10

## 2023-04-15 ENCOUNTER — Ambulatory Visit: Payer: Medicare HMO

## 2023-04-15 ENCOUNTER — Ambulatory Visit
Admission: RE | Admit: 2023-04-15 | Discharge: 2023-04-15 | Disposition: A | Discharge status: Home / Self Care | Payer: Medicare HMO | Source: Ambulatory Visit | Attending: Radiation Oncology | Admitting: Radiation Oncology

## 2023-04-15 ENCOUNTER — Other Ambulatory Visit: Payer: Self-pay

## 2023-04-15 DIAGNOSIS — D0512 Intraductal carcinoma in situ of left breast: Secondary | ICD-10-CM | POA: Diagnosis not present

## 2023-04-15 LAB — RAD ONC ARIA SESSION SUMMARY
Course Elapsed Days: 15
Plan Fractions Treated to Date: 11
Plan Prescribed Dose Per Fraction: 2.67 Gy
Plan Total Fractions Prescribed: 15
Plan Total Prescribed Dose: 40.05 Gy
Reference Point Dosage Given to Date: 29.37 Gy
Reference Point Session Dosage Given: 2.67 Gy
Session Number: 11

## 2023-04-16 ENCOUNTER — Other Ambulatory Visit: Payer: Self-pay

## 2023-04-16 ENCOUNTER — Ambulatory Visit: Payer: Medicare HMO

## 2023-04-16 ENCOUNTER — Ambulatory Visit: Admission: RE | Admit: 2023-04-16 | Payer: Medicare HMO | Source: Ambulatory Visit

## 2023-04-16 DIAGNOSIS — D0512 Intraductal carcinoma in situ of left breast: Secondary | ICD-10-CM | POA: Diagnosis not present

## 2023-04-16 LAB — RAD ONC ARIA SESSION SUMMARY
Course Elapsed Days: 16
Plan Fractions Treated to Date: 12
Plan Prescribed Dose Per Fraction: 2.67 Gy
Plan Total Fractions Prescribed: 15
Plan Total Prescribed Dose: 40.05 Gy
Reference Point Dosage Given to Date: 32.04 Gy
Reference Point Session Dosage Given: 2.67 Gy
Session Number: 12

## 2023-04-19 ENCOUNTER — Ambulatory Visit: Payer: Medicare HMO

## 2023-04-19 ENCOUNTER — Other Ambulatory Visit: Payer: Self-pay

## 2023-04-19 ENCOUNTER — Ambulatory Visit
Admission: RE | Admit: 2023-04-19 | Discharge: 2023-04-19 | Disposition: A | Discharge status: Home / Self Care | Payer: Medicare HMO | Source: Ambulatory Visit | Attending: Radiation Oncology | Admitting: Radiation Oncology

## 2023-04-19 DIAGNOSIS — D0512 Intraductal carcinoma in situ of left breast: Secondary | ICD-10-CM | POA: Diagnosis not present

## 2023-04-19 LAB — RAD ONC ARIA SESSION SUMMARY
Course Elapsed Days: 19
Plan Fractions Treated to Date: 13
Plan Prescribed Dose Per Fraction: 2.67 Gy
Plan Total Fractions Prescribed: 15
Plan Total Prescribed Dose: 40.05 Gy
Reference Point Dosage Given to Date: 34.71 Gy
Reference Point Session Dosage Given: 2.67 Gy
Session Number: 13

## 2023-04-20 ENCOUNTER — Ambulatory Visit: Payer: Medicare HMO

## 2023-04-20 ENCOUNTER — Ambulatory Visit
Admission: RE | Admit: 2023-04-20 | Discharge: 2023-04-20 | Disposition: A | Discharge status: Home / Self Care | Payer: Medicare HMO | Source: Ambulatory Visit | Attending: Radiation Oncology | Admitting: Radiation Oncology

## 2023-04-20 ENCOUNTER — Other Ambulatory Visit: Payer: Self-pay

## 2023-04-20 DIAGNOSIS — D0512 Intraductal carcinoma in situ of left breast: Secondary | ICD-10-CM | POA: Diagnosis not present

## 2023-04-20 LAB — RAD ONC ARIA SESSION SUMMARY
Course Elapsed Days: 20
Plan Fractions Treated to Date: 14
Plan Prescribed Dose Per Fraction: 2.67 Gy
Plan Total Fractions Prescribed: 15
Plan Total Prescribed Dose: 40.05 Gy
Reference Point Dosage Given to Date: 37.38 Gy
Reference Point Session Dosage Given: 2.67 Gy
Session Number: 14

## 2023-04-21 ENCOUNTER — Ambulatory Visit: Payer: Medicare HMO

## 2023-04-21 ENCOUNTER — Other Ambulatory Visit: Payer: Self-pay

## 2023-04-21 DIAGNOSIS — D0512 Intraductal carcinoma in situ of left breast: Secondary | ICD-10-CM | POA: Diagnosis not present

## 2023-04-21 LAB — RAD ONC ARIA SESSION SUMMARY
Course Elapsed Days: 21
Plan Fractions Treated to Date: 15
Plan Prescribed Dose Per Fraction: 2.67 Gy
Plan Total Fractions Prescribed: 15
Plan Total Prescribed Dose: 40.05 Gy
Reference Point Dosage Given to Date: 40.05 Gy
Reference Point Session Dosage Given: 2.67 Gy
Session Number: 15

## 2023-04-22 ENCOUNTER — Ambulatory Visit: Payer: Medicare HMO

## 2023-04-22 ENCOUNTER — Other Ambulatory Visit: Payer: Self-pay

## 2023-04-22 DIAGNOSIS — D0512 Intraductal carcinoma in situ of left breast: Secondary | ICD-10-CM | POA: Diagnosis not present

## 2023-04-22 LAB — RAD ONC ARIA SESSION SUMMARY
Course Elapsed Days: 22
Plan Fractions Treated to Date: 1
Plan Prescribed Dose Per Fraction: 2 Gy
Plan Total Fractions Prescribed: 6
Plan Total Prescribed Dose: 12 Gy
Reference Point Dosage Given to Date: 2 Gy
Reference Point Session Dosage Given: 2 Gy
Session Number: 16

## 2023-04-23 ENCOUNTER — Ambulatory Visit: Payer: Medicare HMO

## 2023-04-23 ENCOUNTER — Other Ambulatory Visit: Payer: Self-pay

## 2023-04-23 ENCOUNTER — Ambulatory Visit
Admission: RE | Admit: 2023-04-23 | Discharge: 2023-04-23 | Disposition: A | Discharge status: Home / Self Care | Payer: Medicare HMO | Source: Ambulatory Visit | Attending: Radiation Oncology | Admitting: Radiation Oncology

## 2023-04-23 DIAGNOSIS — D0512 Intraductal carcinoma in situ of left breast: Secondary | ICD-10-CM | POA: Diagnosis not present

## 2023-04-23 LAB — RAD ONC ARIA SESSION SUMMARY
Course Elapsed Days: 23
Plan Fractions Treated to Date: 2
Plan Prescribed Dose Per Fraction: 2 Gy
Plan Total Fractions Prescribed: 6
Plan Total Prescribed Dose: 12 Gy
Reference Point Dosage Given to Date: 4 Gy
Reference Point Session Dosage Given: 2 Gy
Session Number: 17

## 2023-04-26 ENCOUNTER — Ambulatory Visit: Payer: Medicare HMO

## 2023-04-26 ENCOUNTER — Other Ambulatory Visit: Payer: Self-pay

## 2023-04-26 ENCOUNTER — Ambulatory Visit
Admission: RE | Admit: 2023-04-26 | Discharge: 2023-04-26 | Disposition: A | Discharge status: Home / Self Care | Payer: Medicare HMO | Source: Ambulatory Visit | Attending: Radiation Oncology | Admitting: Radiation Oncology

## 2023-04-26 DIAGNOSIS — D0512 Intraductal carcinoma in situ of left breast: Secondary | ICD-10-CM | POA: Diagnosis not present

## 2023-04-26 LAB — RAD ONC ARIA SESSION SUMMARY
Course Elapsed Days: 26
Plan Fractions Treated to Date: 3
Plan Prescribed Dose Per Fraction: 2 Gy
Plan Total Fractions Prescribed: 6
Plan Total Prescribed Dose: 12 Gy
Reference Point Dosage Given to Date: 6 Gy
Reference Point Session Dosage Given: 2 Gy
Session Number: 18

## 2023-04-27 ENCOUNTER — Other Ambulatory Visit: Payer: Self-pay

## 2023-04-27 ENCOUNTER — Ambulatory Visit
Admission: RE | Admit: 2023-04-27 | Discharge: 2023-04-27 | Disposition: A | Discharge status: Home / Self Care | Payer: Medicare HMO | Source: Ambulatory Visit | Attending: Radiation Oncology | Admitting: Radiation Oncology

## 2023-04-27 DIAGNOSIS — D0512 Intraductal carcinoma in situ of left breast: Secondary | ICD-10-CM | POA: Diagnosis not present

## 2023-04-27 LAB — RAD ONC ARIA SESSION SUMMARY
Course Elapsed Days: 27
Plan Fractions Treated to Date: 4
Plan Prescribed Dose Per Fraction: 2 Gy
Plan Total Fractions Prescribed: 6
Plan Total Prescribed Dose: 12 Gy
Reference Point Dosage Given to Date: 8 Gy
Reference Point Session Dosage Given: 2 Gy
Session Number: 19

## 2023-04-28 ENCOUNTER — Ambulatory Visit: Payer: Medicare HMO

## 2023-04-28 ENCOUNTER — Ambulatory Visit
Admission: RE | Admit: 2023-04-28 | Discharge: 2023-04-28 | Disposition: A | Discharge status: Home / Self Care | Payer: Medicare HMO | Source: Ambulatory Visit | Attending: Radiation Oncology | Admitting: Radiation Oncology

## 2023-04-28 ENCOUNTER — Other Ambulatory Visit: Payer: Self-pay

## 2023-04-28 DIAGNOSIS — D0512 Intraductal carcinoma in situ of left breast: Secondary | ICD-10-CM | POA: Diagnosis not present

## 2023-04-28 LAB — RAD ONC ARIA SESSION SUMMARY
Course Elapsed Days: 28
Plan Fractions Treated to Date: 5
Plan Prescribed Dose Per Fraction: 2 Gy
Plan Total Fractions Prescribed: 6
Plan Total Prescribed Dose: 12 Gy
Reference Point Dosage Given to Date: 10 Gy
Reference Point Session Dosage Given: 2 Gy
Session Number: 20

## 2023-04-29 ENCOUNTER — Other Ambulatory Visit: Payer: Self-pay

## 2023-04-29 ENCOUNTER — Ambulatory Visit
Admission: RE | Admit: 2023-04-29 | Discharge: 2023-04-29 | Disposition: A | Discharge status: Home / Self Care | Payer: Medicare HMO | Source: Ambulatory Visit | Attending: Radiation Oncology | Admitting: Radiation Oncology

## 2023-04-29 DIAGNOSIS — D0512 Intraductal carcinoma in situ of left breast: Secondary | ICD-10-CM | POA: Diagnosis not present

## 2023-04-29 LAB — RAD ONC ARIA SESSION SUMMARY
Course Elapsed Days: 29
Plan Fractions Treated to Date: 6
Plan Prescribed Dose Per Fraction: 2 Gy
Plan Total Fractions Prescribed: 6
Plan Total Prescribed Dose: 12 Gy
Reference Point Dosage Given to Date: 12 Gy
Reference Point Session Dosage Given: 2 Gy
Session Number: 21

## 2023-05-04 ENCOUNTER — Telehealth: Payer: Self-pay

## 2023-05-04 NOTE — Telephone Encounter (Signed)
Patient called in to reports severe pain and burning to left axilla. Patient completed 21 treatments to left breast. Patient reports skin has started to come off. Patient to upload picture to my chart. Pls advise.

## 2023-05-04 NOTE — Progress Notes (Signed)
  Radiation Oncology         [phone] ________________________________  Name: Kaitlyn Ramsey MRN: [id number]  Date: 05/05/2023  DOB: December 16, 1955  End of Treatment Note  Diagnosis: Stage 0 (cTis (DCIS), cN0, cM0) Left Breast, Intermediate grade DCIS involving an intraductal papilloma, ER+ / PR+ / Her2 not assessed: s/p lumpectomy      Indication for treatment: Curative        Radiation treatment dates: 03/31/23 through 04/29/23   Site/dose:   1) Left breast 40.05 Gy delivered in 15 Fx at 2.67 Gy/Fx 2) Left breast boost - 12 Gy delivered in 6 Fx at 2.00 Gy/Fx  Beams/energy: 10X-FFF  Narrative: The patient tolerated radiation treatment relatively well. During her final weekly treatment check on 04/27/23, the patient endorsed left axillary pain rated at an 8/10. Physical exam performed on that same date showed hyperpigmentation changes to the left breast area without any skin breakdown appreciated.   Plan: The patient has completed radiation treatment. The patient will return to radiation oncology clinic for routine followup in one month. I advised them to call or return sooner if they have any questions or concerns related to their recovery or treatment.  -----------------------------------  Billie Lade, PhD, MD  This document serves as a record of services personally performed by Antony Blackbird, MD. It was created on his behalf by Neena Rhymes, a trained medical scribe. The creation of this record is based on the scribe's personal observations and the provider's statements to them. This document has been checked and approved by the attending provider.

## 2023-05-04 NOTE — Progress Notes (Signed)
Radiation Oncology         [phone] ________________________________  Name: Kaitlyn Ramsey MRN: [id number]  Date: 05/05/2023  DOB: 04/22/1956  Follow-Up Visit Note  CC: Renaye Rakers, MD  Renaye Rakers, MD    ICD-10-CM   1. Ductal carcinoma in situ (DCIS) of left breast [D05.12]  D05.12 silver sulfADIAZINE (SILVADENE) 1 % cream      Diagnosis: Stage 0 (cTis (DCIS), cN0, cM0) Left Breast, Intermediate grade DCIS involving an intraductal papilloma, ER+ / PR+ / Her2 not assessed: s/p lumpectomy      Interval Since Last Radiation: 5 days   Indication for treatment: Curative       Radiation treatment dates: 03/31/23 through 04/29/23  Site/dose:   1) Left breast 40.05 Gy delivered in 15 Fx at 2.67 Gy/Fx 2) Left breast boost - 12 Gy delivered in 6 Fx at 2.00 Gy/Fx Beams/energy: 10X-FFF   Narrative:  The patient returns today for a skin check. She contacted our office yesterday to report severe pain and burning to the left axillary region and skin sloughing in the same area. She was sent the below picture of the left axillary area:                                 During her final weekly treatment check on 04/27/23, the patient endorsed left axillary pain rated at an 8/10. Physical exam performed on that same date showed hyperpigmentation changes to the left breast area without any skin breakdown appreciated. She otherwise tolerated radiation therapy relatively well.   No other significant oncologic interval history since the patient completed radiation therapy or since she was seen in consultation this past July.   She denies any chills or fever.  She has been applying sonafine to her skin.   Allergies:  is allergic to penicillins.  Meds: Current Outpatient Medications  Medication Sig Dispense Refill   atorvastatin (LIPITOR) 20 MG tablet Take 20 mg by mouth daily.     Biotin 40981 MCG TABS Take 10,000 mcg by mouth daily.     Calcium Citrate-Vitamin D (CALCIUM + D PO)  Take 1 tablet by mouth daily.     Cholecalciferol (VITAMIN D3 MAXIMUM STRENGTH) 125 MCG (5000 UT) capsule Take 5,000 Units by mouth daily.     doxycycline (VIBRA-TABS) 100 MG tablet Take 1 tablet (100 mg total) by mouth 2 (two) times daily. 14 tablet 0   gabapentin (NEURONTIN) 600 MG tablet Take 600 mg by mouth 3 (three) times daily.     metoprolol tartrate (LOPRESSOR) 25 MG tablet Take 25 mg by mouth 2 (two) times daily.     Milk Thistle 1000 MG CAPS Take 1,000 mg by mouth daily.     Multiple Vitamin (MULTIVITAMIN WITH MINERALS) TABS tablet Take 1 tablet by mouth daily.     oxyCODONE-acetaminophen (PERCOCET) 10-325 MG tablet Take 1 tablet by mouth every 6 (six) hours as needed for pain.     Semaglutide, 2 MG/DOSE, (OZEMPIC, 2 MG/DOSE,) 8 MG/3ML SOPN Take 2 mg by mouth every Tuesday.     amLODipine (NORVASC) 10 MG tablet Take 10 mg by mouth at bedtime. (Patient not taking: Reported on 05/05/2023)     valsartan-hydrochlorothiazide (DIOVAN-HCT) 160-25 MG tablet Take 1 tablet by mouth daily.     No current facility-administered medications for this encounter.    Physical Findings: The patient is in no acute distress. Patient is alert and oriented.  height is 5\' 6"  (1.676 m) and weight is 212 lb (96.2 kg). Her temperature is 97.9 F (36.6 C). Her blood pressure is 137/60 and her pulse is 90. Her respiration is 20 and oxygen saturation is 99%. .  No significant changes. Lungs are clear to auscultation bilaterally. Heart has regular rate and rhythm. No palpable cervical, supraclavicular, or axillary adenopathy. Abdomen soft, non-tender, normal bowel sounds.   Left Breast: Hyperpigmentation changes throughout the left breast.  Mild dry desquamation in the inframammary fold.  No moist desquamation.  Skin breakdown in the left axillary area without any significant drainage.  Mild erythema in the upper outer aspect of the breast  Lab Findings: Lab Results  Component Value Date   WBC 6.3 01/27/2023    HGB 14.5 01/27/2023   HCT 39.5 01/27/2023   MCV 81.4 01/27/2023   PLT 278 01/27/2023    Radiographic Findings: No results found.  Impression: Stage 0 (cTis (DCIS), cN0, cM0) Left Breast, Intermediate grade DCIS involving an intraductal papilloma, ER+ / PR+ / Her2 not assessed: s/p lumpectomy      The patient is recovering from the effects of radiation.  She is experiencing significant discomfort from her radiation reaction.  She has been given Silvadene to place on the areas of her skin which show dry desquamation/early moist desquamation.  Unsure if she has cellulitis.but  I have placed her on doxycycline just in case.  She has narcotic pain medication from her physician.  Plan: She will keep her scheduled follow-up in early October.  She knows to call if her skin reaction does not improve over the next several days.   ____________________________________  Billie Lade, PhD, MD  This document serves as a record of services personally performed by Antony Blackbird, MD. It was created on his behalf by Neena Rhymes, a trained medical scribe. The creation of this record is based on the scribe's personal observations and the provider's statements to them. This document has been checked and approved by the attending provider.

## 2023-05-05 ENCOUNTER — Ambulatory Visit
Admission: RE | Admit: 2023-05-05 | Discharge: 2023-05-05 | Disposition: A | Discharge status: Home / Self Care | Payer: Medicare HMO | Source: Ambulatory Visit | Attending: Radiation Oncology | Admitting: Radiation Oncology

## 2023-05-05 ENCOUNTER — Encounter: Payer: Self-pay | Admitting: Radiation Oncology

## 2023-05-05 VITALS — BP 137/60 | HR 90 | Temp 97.9°F | Resp 20 | Ht 66.0 in | Wt 212.0 lb

## 2023-05-05 DIAGNOSIS — D0512 Intraductal carcinoma in situ of left breast: Secondary | ICD-10-CM | POA: Insufficient documentation

## 2023-05-05 DIAGNOSIS — Z923 Personal history of irradiation: Secondary | ICD-10-CM | POA: Insufficient documentation

## 2023-05-05 DIAGNOSIS — Z170 Estrogen receptor positive status [ER+]: Secondary | ICD-10-CM | POA: Insufficient documentation

## 2023-05-05 MED ORDER — DOXYCYCLINE HYCLATE 100 MG PO TABS: 100.0000 mg | ORAL_TABLET | Freq: Two times a day (BID) | ORAL | 0 refills | Status: DC

## 2023-05-05 MED ORDER — SILVER SULFADIAZINE 1 % EX CREA: TOPICAL_CREAM | Freq: Once | CUTANEOUS | Status: AC

## 2023-05-05 NOTE — Progress Notes (Signed)
Kaitlyn Ramsey is here today for follow up post radiation to the breast.   Breast Side: Left   They completed their radiation on: 04/29/23   Does the patient complain of any of the following: Post radiation skin issues: Hyperpigmented, Reports left breast feels warm at times.  Breast Tenderness:  Patient reports pain to left axilla. Patient states ," It feels like a match is burning my skin. Rates pain at 8/10. Patient reports pain worsens when she takes deep breaths.  Breast Swelling: Yes Lymphadema: No Range of Motion limitations: Yes , pulling to left axilla.  Fatigue post radiation: Yes Appetite good/fair/poor: Fair    Additional comments if applicable:   BP 137/60 (BP Location: Right Arm, Patient Position: Sitting, Cuff Size: Large)   Pulse 90   Temp 97.9 F (36.6 C)   Resp 20   Ht 5\' 6"  (1.676 m)   Wt 212 lb (96.2 kg)   SpO2 99%   BMI 34.22 kg/m

## 2023-05-07 ENCOUNTER — Encounter: Payer: Self-pay | Admitting: *Deleted

## 2023-05-10 ENCOUNTER — Other Ambulatory Visit: Payer: Self-pay

## 2023-05-10 ENCOUNTER — Inpatient Hospital Stay: Payer: Medicare HMO | Attending: Hematology and Oncology | Admitting: Hematology and Oncology

## 2023-05-10 VITALS — BP 141/69 | HR 77 | Temp 98.1°F | Resp 18 | Ht 66.0 in | Wt 212.5 lb

## 2023-05-10 DIAGNOSIS — D0512 Intraductal carcinoma in situ of left breast: Secondary | ICD-10-CM | POA: Diagnosis present

## 2023-05-10 DIAGNOSIS — Z87891 Personal history of nicotine dependence: Secondary | ICD-10-CM | POA: Insufficient documentation

## 2023-05-10 NOTE — Progress Notes (Signed)
[facility] CONSULT NOTE  Patient Care Team: Renaye Rakers, MD as PCP - General (Family Medicine) Rachel Moulds, MD as Consulting Physician (Hematology and Oncology) Antony Blackbird, MD as Consulting Physician (Radiation Oncology) Manus Rudd, MD as Consulting Physician (General Surgery) Pershing Proud, RN as Oncology Nurse Navigator Donnelly Angelica, RN as Oncology Nurse Navigator  CHIEF COMPLAINTS/PURPOSE OF CONSULTATION:  Newly diagnosed breast cancer  HISTORY OF PRESENTING ILLNESS:  Kaitlyn Ramsey 67 y.o. female is here because of recent diagnosis of left breast DCIS  I reviewed her records extensively and collaborated the history with the patient.  SUMMARY OF ONCOLOGIC HISTORY: Oncology History  Ductal carcinoma in situ (DCIS) of left breast  12/30/2022 Mammogram   Bilateral screening mammogram showed a possible asymmetry in the left breast.  Diagnostic mammogram showed suspicious 1 cm mass in the left breast at 2:00 2 cm from the nipple, lymph node with borderline cortical thickening in the left axilla   01/15/2023 Pathology Results   Left breast needle core biopsy at 2:00 showed DCIS, intermediate nuclear grade, cribriform type with necrosis, negative for invasive carcinoma.  Lymph node was benign and reactive.  Prognostic showed ER positive at 100% strong staining PR 95% positive strong staining   01/26/2023 Initial Diagnosis   Ductal carcinoma in situ (DCIS) of left breast    Since her last visit here she had left breast lumpectomy, final pathology showed DCIS, cribriform type involving an intraductal papilloma, necrosis not identified, margins negative, prognostic showed ER 100% PR 95%. She is now undergoing radiation and is here to follow-up on antiestrogen therapy  MEDICAL HISTORY:  Past Medical History:  Diagnosis Date   Anxiety    Dysrhythmia    Gastritis    Hypertension     SURGICAL HISTORY: Past Surgical History:  Procedure Laterality  Date   BREAST BIOPSY Left 01/15/2023   [place] LT BREAST BX W LOC DEV 1ST LESION IMG BX SPEC US GUIDE 01/15/2023 [facility]MO[facility]   BREAST BIOPSY  02/16/2023   MM LT RADIOACTIVE SEED LOC MAMMO GUIDE 02/16/2023 [facility]MO[facility]   BREAST LUMPECTOMY WITH RADIOACTIVE SEED LOCALIZATION Left 02/17/2023   Procedure: LEFT BREAST LUMPECTOMY WITH RADIOACTIVE SEED LOCALIZATION;  Surgeon: Manus Rudd, MD;  Location: [facility] OR;  Service: General;  Laterality: Left;  LMA   COLONOSCOPY     KNEE ARTHROSCOPY Left     SOCIAL HISTORY: Social History   Socioeconomic History   Marital status: Single    Spouse name: Not on file   Number of children: Not on file   Years of education: Not on file   Highest education level: Not on file  Occupational History   Not on file  Tobacco Use   Smoking status: Former    Types: Cigarettes   Smokeless tobacco: Never  Vaping Use   Vaping status: Never Used  Substance and Sexual Activity   Alcohol use: Never   Drug use: Never   Sexual activity: Not on file  Other Topics Concern   Not on file  Social History Narrative   Not on file   Social Determinants of Health   Financial Resource Strain: Not on file  Food Insecurity: Food Insecurity Present (03/17/2023)   Hunger Vital Sign    Worried About Running Out of Food in the Last Year: Sometimes true    Ran Out of Food in the Last Year: Sometimes true  Transportation Needs: No Transportation Needs (03/17/2023)   PRAPARE - Transportation    Lack of  Transportation (Medical): No    Lack of Transportation (Non-Medical): No  Physical Activity: Not on file  Stress: Not on file  Social Connections: Unknown (01/13/2022)   Received from [facility]   Social Network    Social Network: Not on file  Intimate Partner Violence: Not At Risk (03/17/2023)   Humiliation, Afraid, Rape, and Kick questionnaire    Fear of Current or Ex-Partner: No    Emotionally Abused: No    Physically Abused: No    Sexually Abused: No     FAMILY HISTORY: Family History  Problem Relation Age of Onset   Cancer Paternal Aunt        unknown type   Cancer Cousin        unknown type, paternal first cousin    ALLERGIES:  is allergic to penicillins.  MEDICATIONS:  Current Outpatient Medications  Medication Sig Dispense Refill   amLODipine (NORVASC) 10 MG tablet Take 10 mg by mouth at bedtime. (Patient not taking: Reported on 05/05/2023)     atorvastatin (LIPITOR) 20 MG tablet Take 20 mg by mouth daily.     Biotin 21308 MCG TABS Take 10,000 mcg by mouth daily.     Calcium Citrate-Vitamin D (CALCIUM + D PO) Take 1 tablet by mouth daily.     Cholecalciferol (VITAMIN D3 MAXIMUM STRENGTH) 125 MCG (5000 UT) capsule Take 5,000 Units by mouth daily.     doxycycline (VIBRA-TABS) 100 MG tablet Take 1 tablet (100 mg total) by mouth 2 (two) times daily. 14 tablet 0   gabapentin (NEURONTIN) 600 MG tablet Take 600 mg by mouth 3 (three) times daily.     metoprolol tartrate (LOPRESSOR) 25 MG tablet Take 25 mg by mouth 2 (two) times daily.     Milk Thistle 1000 MG CAPS Take 1,000 mg by mouth daily.     Multiple Vitamin (MULTIVITAMIN WITH MINERALS) TABS tablet Take 1 tablet by mouth daily.     oxyCODONE-acetaminophen (PERCOCET) 10-325 MG tablet Take 1 tablet by mouth every 6 (six) hours as needed for pain.     Semaglutide, 2 MG/DOSE, (OZEMPIC, 2 MG/DOSE,) 8 MG/3ML SOPN Take 2 mg by mouth every Tuesday.     valsartan-hydrochlorothiazide (DIOVAN-HCT) 160-25 MG tablet Take 1 tablet by mouth daily.     No current facility-administered medications for this visit.  Medication reconciliation to be updated  PHYSICAL EXAMINATION: ECOG PERFORMANCE STATUS: 0 - Asymptomatic  Vitals:   05/10/23 0846  BP: (!) 141/69  Pulse: 77  Resp: 18  Temp: 98.1 F (36.7 C)  SpO2: 99%   Filed Weights   05/10/23 0846  Weight: 212 lb 8 oz (96.4 kg)    GENERAL:alert, no distress and comfortable Breast: Skin desquamation on the left side from  radiation  LABORATORY DATA:  I have reviewed the data as listed Lab Results  Component Value Date   WBC 6.3 01/27/2023   HGB 14.5 01/27/2023   HCT 39.5 01/27/2023   MCV 81.4 01/27/2023   PLT 278 01/27/2023   Lab Results  Component Value Date   NA 138 01/27/2023   K 3.8 01/27/2023   CL 100 01/27/2023   CO2 32 01/27/2023    RADIOGRAPHIC STUDIES: I have personally reviewed the radiological reports and agreed with the findings in the report.  ASSESSMENT AND PLAN:   Ductal carcinoma in situ (DCIS) of left breast This is a very pleasant 67 yr old female patient with new diagnosis of left breast DCIS, ER PR positive referred to breast [facility] for  recommendations. We have discussed the following findings about DCIS. She is status postlumpectomy, final pathology showed DCIS, no invasive cancer , negative margins ER/PR positive.  She is now status post adjuvant radiation and is here to initiate antiestrogen therapy.  She is doing well except for some skin desquamation from radiation.  We once again reviewed the role of antiestrogen therapy, mechanical fraction, adverse effects including but not limited to fatigue, hot flashes, vaginal dryness, arthralgias and bone density loss.  I will order baseline bone density scan.  She is leaning towards aromatase inhibitors and it will be dispensed to the pharmacy of her choice.  She will return to clinic in February for survivorship and toxicity check   All questions were answered. The patient knows to call the clinic with any problems, questions or concerns.    Rachel Moulds, MD 05/10/23

## 2023-05-10 NOTE — Assessment & Plan Note (Signed)
This is a very pleasant 67 yr old female patient with new diagnosis of left breast DCIS, ER PR positive referred to breast [facility] for recommendations. We have discussed the following findings about DCIS. She is status postlumpectomy, final pathology showed DCIS, no invasive cancer , negative margins ER/PR positive.  She is now status post adjuvant radiation and is here to initiate antiestrogen therapy.  She is doing well except for some skin desquamation from radiation.  We once again reviewed the role of antiestrogen therapy, mechanical fraction, adverse effects including but not limited to fatigue, hot flashes, vaginal dryness, arthralgias and bone density loss.  I will order baseline bone density scan.  She is leaning towards aromatase inhibitors and it will be dispensed to the pharmacy of her choice.  She will return to clinic in February for survivorship and toxicity check

## 2023-05-13 ENCOUNTER — Telehealth: Payer: Self-pay | Admitting: Adult Health

## 2023-05-13 NOTE — Telephone Encounter (Signed)
 Left patient a message regarding scheduled appointment times/dates

## 2023-05-17 IMAGING — MG MM DIGITAL SCREENING BILAT W/ TOMO AND CAD
6 of 12 series · 6 of 36 positions shown · non-contrast
Comparison: Previous exam(s).

ACR Breast Density Category a: The breast tissue is almost entirely
fatty.

CLINICAL DATA: Screening.

EXAM:
DIGITAL SCREENING BILATERAL MAMMOGRAM WITH TOMOSYNTHESIS AND CAD
TECHNIQUE: Bilateral screening digital craniocaudal and mediolateral oblique
mammograms were obtained. Bilateral screening digital breast
tomosynthesis was performed. The images were evaluated with
computer-aided detection.

[L CC synth-2D]
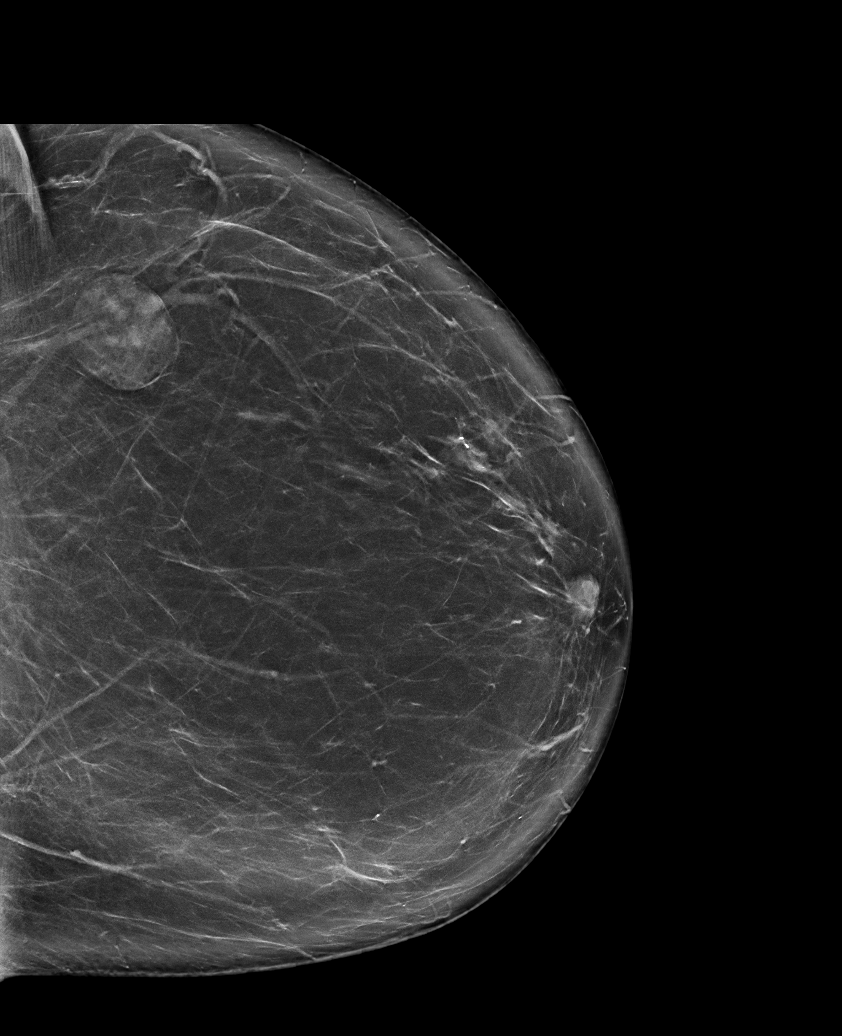

[L MLO synth-2D (1 of 2)]
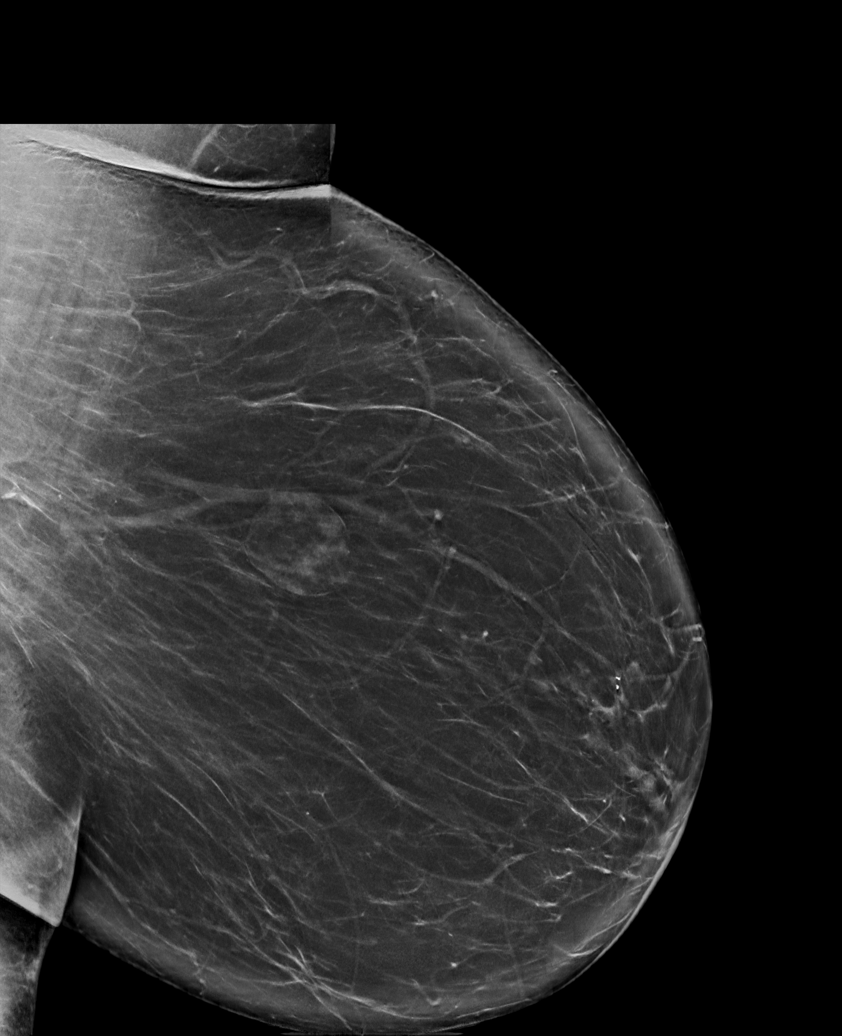

[R CC synth-2D]
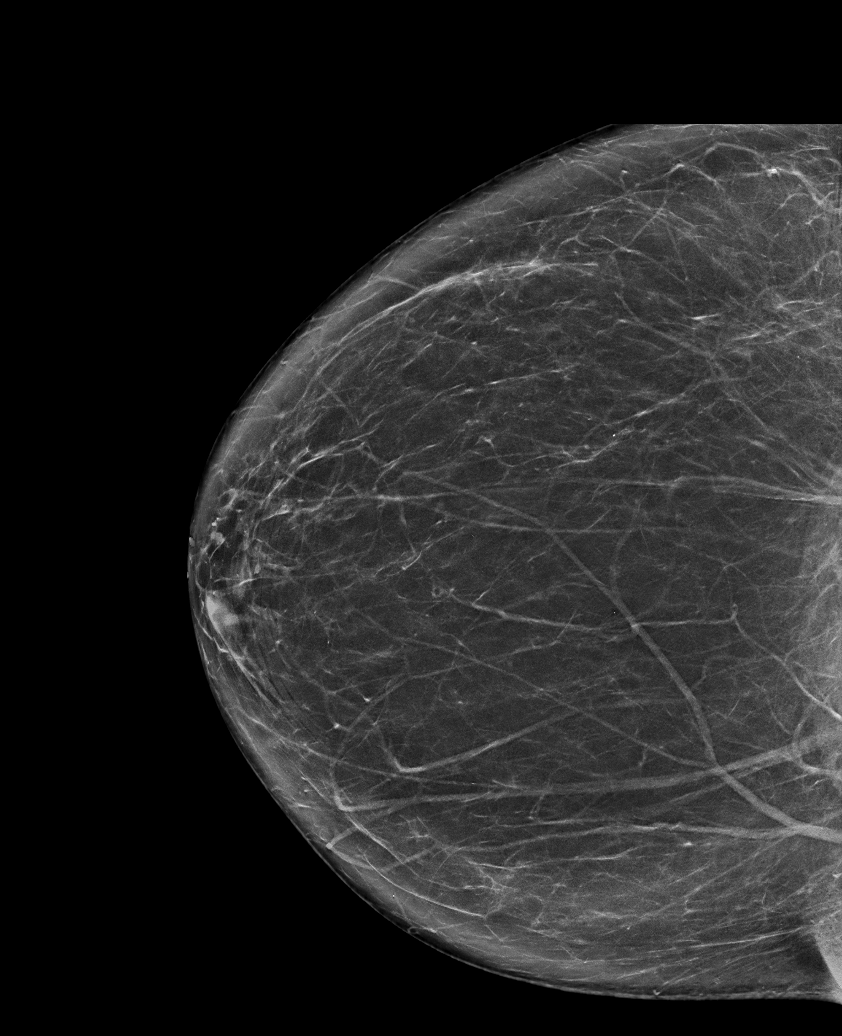

[R MLO synth-2D (1 of 2)]
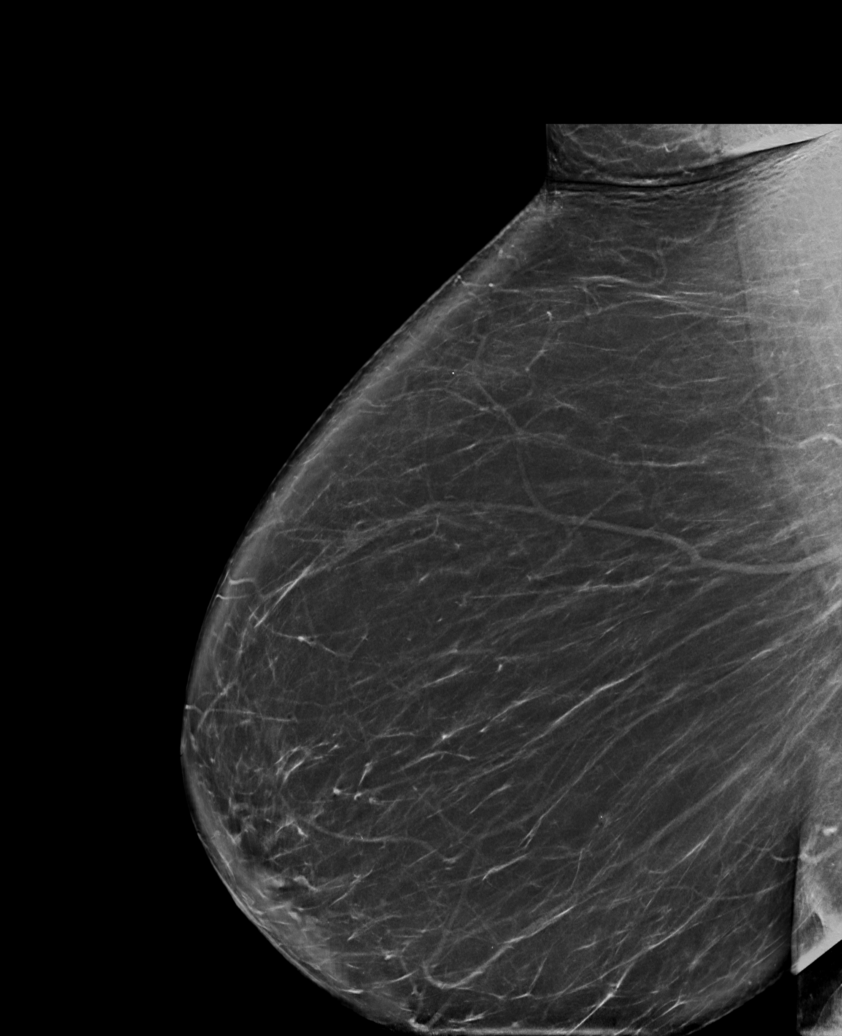

[L MLO synth-2D (2 of 2)]
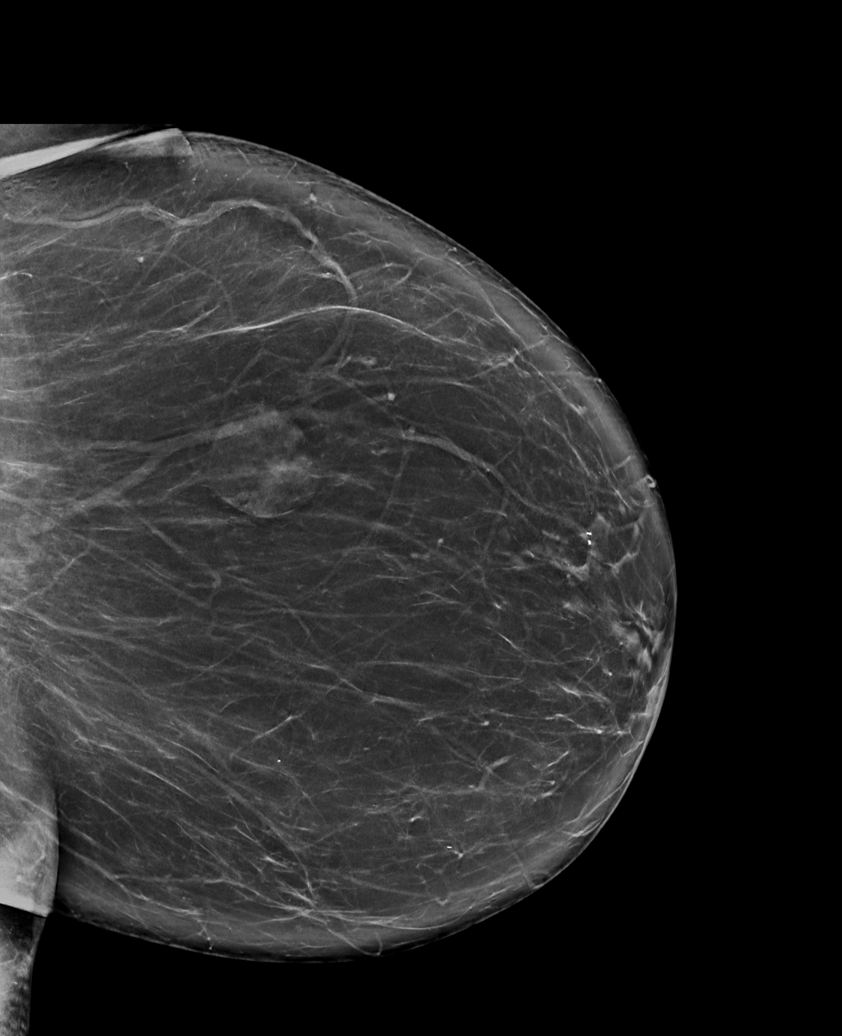

[R MLO synth-2D (2 of 2)]
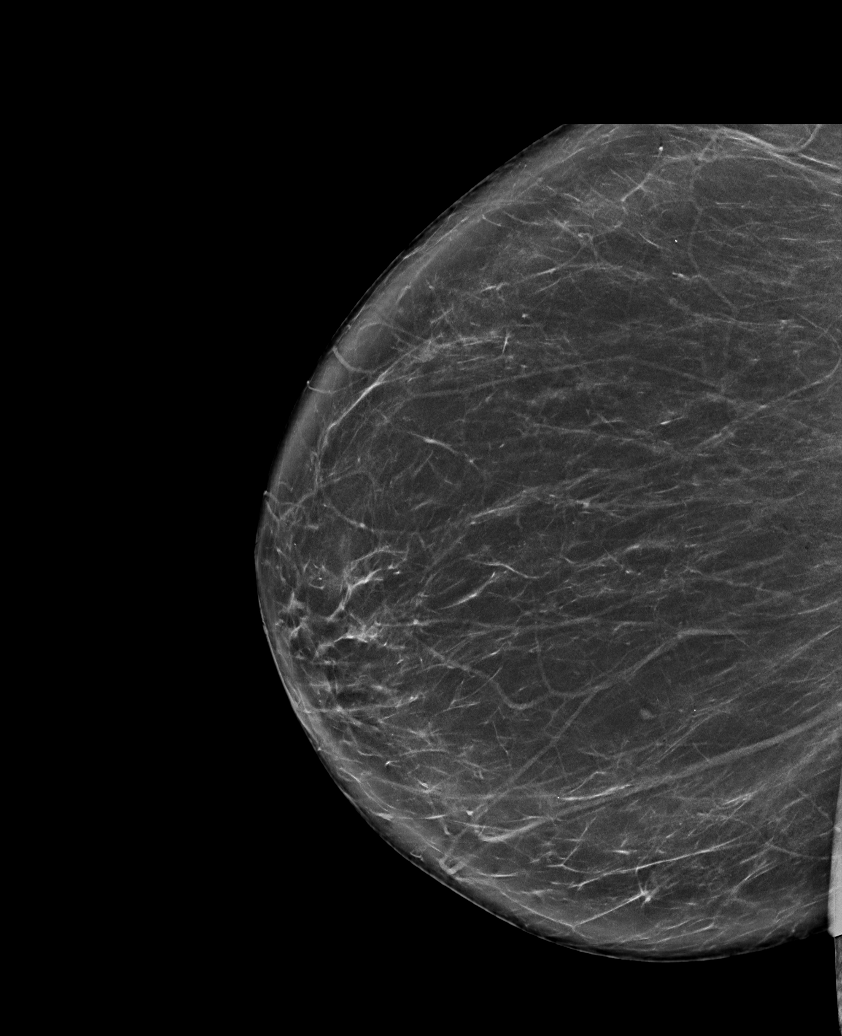

[6 of 36 positions shown; findings below may reference images not displayed]

FINDINGS: There are no findings suspicious for malignancy.
IMPRESSION: No mammographic evidence of malignancy. A result letter of this
screening mammogram will be mailed directly to the patient.

RECOMMENDATION:
Screening mammogram in one year. (Code:0E-3-N98)

BI-RADS CATEGORY  1: Negative.

## 2023-06-04 ENCOUNTER — Encounter: Payer: Self-pay | Admitting: Radiation Oncology

## 2023-06-07 ENCOUNTER — Ambulatory Visit
Admission: RE | Admit: 2023-06-07 | Discharge: 2023-06-07 | Disposition: A | Discharge status: Home / Self Care | Payer: Medicare HMO | Source: Ambulatory Visit | Attending: Radiation Oncology | Admitting: Radiation Oncology

## 2023-06-07 ENCOUNTER — Encounter: Payer: Self-pay | Admitting: Radiation Oncology

## 2023-06-07 VITALS — BP 129/68 | HR 75 | Temp 97.2°F | Resp 20 | Ht 66.0 in | Wt 209.4 lb

## 2023-06-07 DIAGNOSIS — D0512 Intraductal carcinoma in situ of left breast: Secondary | ICD-10-CM | POA: Insufficient documentation

## 2023-06-07 DIAGNOSIS — Z923 Personal history of irradiation: Secondary | ICD-10-CM | POA: Diagnosis not present

## 2023-06-07 DIAGNOSIS — Z79899 Other long term (current) drug therapy: Secondary | ICD-10-CM | POA: Insufficient documentation

## 2023-06-07 DIAGNOSIS — Z170 Estrogen receptor positive status [ER+]: Secondary | ICD-10-CM | POA: Diagnosis not present

## 2023-06-07 HISTORY — DX: Personal history of irradiation: Z92.3

## 2023-06-07 NOTE — Progress Notes (Signed)
Kaitlyn Ramsey is here today for follow up post radiation to the breast.   Breast Side:Left   They completed their radiation on: 04/29/23  Does the patient complain of any of the following: Post radiation skin issues:  No Breast Tenderness: Yes  Breast Swelling: Yes mild swelling.  Lymphadema: No  Range of Motion limitations: No  Fatigue post radiation: Yes, but improving  Appetite good/fair/poor: Good  Additional comments if applicable:   Patient noticed bump under right breast.   BP 129/68 (BP Location: Left Arm, Patient Position: Sitting, Cuff Size: Normal)   Pulse 75   Temp (!) 97.2 F (36.2 C)   Resp 20   Ht 5\' 6"  (1.676 m)   Wt 209 lb 6.4 oz (95 kg)   SpO2 97%   BMI 33.80 kg/m

## 2023-06-07 NOTE — Progress Notes (Signed)
Radiation Oncology         [phone] ________________________________  Name: Kaitlyn Ramsey MRN: [id number]  Date: 06/07/2023  DOB: 14-Mar-1956  Follow-Up Visit Note  CC: Renaye Rakers, MD  Renaye Rakers, MD    ICD-10-CM   1. Ductal carcinoma in situ (DCIS) of left breast [D05.12]  D05.12       Diagnosis: Stage 0 (cTis (DCIS), cN0, cM0) Left Breast, Intermediate grade DCIS involving an intraductal papilloma, ER+ / PR+ / Her2 not assessed: s/p lumpectomy      Interval Since Last Radiation: 1 month and 9 days   Indication for treatment: Curative       Radiation treatment dates: 03/31/23 through 04/29/23  Site/dose:   1) Left breast 40.05 Gy delivered in 15 Fx at 2.67 Gy/Fx 2) Left breast boost - 12 Gy delivered in 6 Fx at 2.00 Gy/Fx Beams/energy: 10X-FFF   Narrative:  The patient returns today for routine follow-up. She was last seen here on 05/05/23 for a skin check due to severe pain and burning to the left axillary region and skin sloughing in in the same area. She was subsequently given Silvadene and prescribed a course of doxycycline to prophylactically treat for cellulitis.     Since her last visit, she followed up with Dr. Al Pimple on 05/10/23. Based on her discussion with Dr. Al Pimple, she has opted to proceed with AI.   No other significant oncologic interval history since the patient was last seen for follow-up.   She reports her skin is healed well at this time.  She has some discomfort in the breast centrally near the nipple area.  She denies any nipple discharge or bleeding.                            Allergies:  is allergic to penicillins.  Meds: Current Outpatient Medications  Medication Sig Dispense Refill   amLODipine (NORVASC) 10 MG tablet Take 10 mg by mouth at bedtime.     atorvastatin (LIPITOR) 20 MG tablet Take 20 mg by mouth daily.     Biotin 40981 MCG TABS Take 10,000 mcg by mouth daily.     Calcium Citrate-Vitamin D (CALCIUM + D PO) Take 1 tablet by  mouth daily.     Cholecalciferol (VITAMIN D3 MAXIMUM STRENGTH) 125 MCG (5000 UT) capsule Take 5,000 Units by mouth daily.     doxycycline (VIBRA-TABS) 100 MG tablet Take 1 tablet (100 mg total) by mouth 2 (two) times daily. 14 tablet 0   gabapentin (NEURONTIN) 600 MG tablet Take 600 mg by mouth 3 (three) times daily.     metoprolol tartrate (LOPRESSOR) 25 MG tablet Take 25 mg by mouth 2 (two) times daily.     Milk Thistle 1000 MG CAPS Take 1,000 mg by mouth daily.     Multiple Vitamin (MULTIVITAMIN WITH MINERALS) TABS tablet Take 1 tablet by mouth daily.     oxyCODONE-acetaminophen (PERCOCET) 10-325 MG tablet Take 1 tablet by mouth every 6 (six) hours as needed for pain.     Semaglutide, 2 MG/DOSE, (OZEMPIC, 2 MG/DOSE,) 8 MG/3ML SOPN Take 2 mg by mouth every Tuesday.     valsartan-hydrochlorothiazide (DIOVAN-HCT) 160-25 MG tablet Take 1 tablet by mouth daily.     No current facility-administered medications for this encounter.    Physical Findings: The patient is in no acute distress. Patient is alert and oriented.  height is 5\' 6"  (1.676 m) and weight is 209 lb  6.4 oz (95 kg). Her temperature is 97.2 F (36.2 C) (abnormal). Her blood pressure is 129/68 and her pulse is 75. Her respiration is 20 and oxygen saturation is 97%. .  No significant changes. Lungs are clear to auscultation bilaterally. Heart has regular rate and rhythm. No palpable cervical, supraclavicular, or axillary adenopathy. Abdomen soft, non-tender, normal bowel sounds.  Right Breast: no palpable mass, nipple discharge or bleeding. Left Breast: Skin is healed well.  Some induration adjacent to her lumpectomy scar no suspicious mass is palpable.  No nipple discharge or bleeding.  Some hyperpigmentation changes throughout the breast.  Mild to moderate amount of edema within the breast.  Lab Findings: Lab Results  Component Value Date   WBC 6.3 01/27/2023   HGB 14.5 01/27/2023   HCT 39.5 01/27/2023   MCV 81.4 01/27/2023    PLT 278 01/27/2023    Radiographic Findings: No results found.  Impression: Stage 0 (cTis (DCIS), cN0, cM0) Left Breast, Intermediate grade DCIS involving an intraductal papilloma, ER+ / PR+ / Her2 not assessed: s/p lumpectomy      The patient is recovering from the effects of radiation.  Continues to have some residual discomfort and swelling of the breast.  We discussed that if the swelling persists or worsens she may be a candidate for physical therapy concerning breast lymphedema.  Plan: As needed follow-up in radiation oncology.  She will continue close follow-up and medical oncology and continue on adjuvant hormonal therapy.  She will call if she wishes for referral for physical therapy.    ____________________________________  Billie Lade, PhD, MD  This document serves as a record of services personally performed by Antony Blackbird, MD. It was created on his behalf by Neena Rhymes, a trained medical scribe. The creation of this record is based on the scribe's personal observations and the provider's statements to them. This document has been checked and approved by the attending provider.

## 2023-06-18 ENCOUNTER — Telehealth: Payer: Self-pay | Admitting: *Deleted

## 2023-06-18 NOTE — Telephone Encounter (Signed)
RETURNED PATIENT'S PHONE CALL, SPOKE WITH PATIENT. ?

## 2023-06-21 ENCOUNTER — Encounter: Payer: Self-pay | Admitting: Radiation Oncology

## 2023-06-21 ENCOUNTER — Other Ambulatory Visit: Payer: Self-pay

## 2023-06-21 ENCOUNTER — Ambulatory Visit
Admission: RE | Admit: 2023-06-21 | Discharge: 2023-06-21 | Disposition: A | Discharge status: Home / Self Care | Payer: Medicare HMO | Source: Ambulatory Visit | Attending: Radiation Oncology | Admitting: Radiation Oncology

## 2023-06-21 VITALS — BP 128/67 | HR 75 | Temp 97.3°F | Resp 18 | Ht 66.0 in | Wt 209.1 lb

## 2023-06-21 DIAGNOSIS — D0512 Intraductal carcinoma in situ of left breast: Secondary | ICD-10-CM

## 2023-06-21 NOTE — Progress Notes (Signed)
Kaitlyn Ramsey is here today for follow up post radiation to the breast.   Breast Side: Left   They completed their radiation on: 04/29/23   Does the patient complain of any of the following: Post radiation skin issues: No   Breast Tenderness: Yes,. Reports pain to breast can be severe at times. Rating 8/10 today. Patient taking Oxycodone-apap 10-325 mg. Breast Swelling: Yes Lymphadema: No Range of Motion limitations: No Fatigue post radiation: Yes  Appetite good/fair/poor -Fair, patient states appetite comes and goes.   Additional comments if applicable:  BP 128/67 (BP Location: Left Arm, Patient Position: Sitting)   Pulse 75   Temp (!) 97.3 F (36.3 C) (Skin)   Resp 18   Ht 5\' 6"  (1.676 m)   Wt 209 lb 2 oz (94.9 kg)   SpO2 99%   BMI 33.75 kg/m

## 2023-06-21 NOTE — Progress Notes (Signed)
Radiation Oncology         [phone] ________________________________  Name: Kaitlyn Ramsey MRN: [id number]  Date: 06/21/2023  DOB: 01-Feb-1956  Follow-Up Visit Note  CC: Renaye Rakers, MD  Renaye Rakers, MD    ICD-10-CM   1. Ductal carcinoma in situ (DCIS) of left breast  D05.12 Ambulatory referral to Physical Therapy      Diagnosis: Stage 0 (cTis (DCIS), cN0, cM0) Left Breast, Intermediate grade DCIS involving an intraductal papilloma, ER+ / PR+ / Her2 not assessed: s/p lumpectomy     Interval Since Last Radiation: 1 month and 22 days   Indication for treatment: Curative       Radiation treatment dates: 03/31/23 through 04/29/23  Site/dose:   1) Left breast 40.05 Gy delivered in 15 Fx at 2.67 Gy/Fx 2) Left breast boost - 12 Gy delivered in 6 Fx at 2.00 Gy/Fx Beams/energy: 10X-FFF   Narrative:  The patient returns today for a symptom check due to ongoing breast pain. She was last seen here for follow-up on 06/07/23. To review, she was also seen here on 05/05/23 for a skin check due to severe pain and burning to the left axillary region and skin sloughing in the same area. She was given Silvadene and prescribed a course of doxycycline at that time to prophylactically treat for cellulitis. At the time of our last visit, her skin was healing well. She did endorse some discomfort in the central breast near the nipple area. We discussed that if her swelling were to persist or worsen she may be a candidate for physical therapy to address breast lymphedema.             She reported that her breast pain was improving but then all of a sudden it started getting worse again.  She denies any obvious trauma to the breast to initiate this pain.  She denies any nipple discharge or bleeding.  She denies any chills or fever.  She has been taking Neurontin as well as oxycodone for pain issues.                Allergies:  is allergic to penicillins.  Meds: Current Outpatient Medications   Medication Sig Dispense Refill   amLODipine (NORVASC) 10 MG tablet Take 10 mg by mouth at bedtime.     atorvastatin (LIPITOR) 20 MG tablet Take 20 mg by mouth daily.     Biotin 95284 MCG TABS Take 10,000 mcg by mouth daily.     Calcium Citrate-Vitamin D (CALCIUM + D PO) Take 1 tablet by mouth daily.     Cholecalciferol (VITAMIN D3 MAXIMUM STRENGTH) 125 MCG (5000 UT) capsule Take 5,000 Units by mouth daily.     doxycycline (VIBRA-TABS) 100 MG tablet Take 1 tablet (100 mg total) by mouth 2 (two) times daily. 14 tablet 0   gabapentin (NEURONTIN) 600 MG tablet Take 600 mg by mouth 3 (three) times daily.     metoprolol tartrate (LOPRESSOR) 25 MG tablet Take 25 mg by mouth 2 (two) times daily.     Milk Thistle 1000 MG CAPS Take 1,000 mg by mouth daily.     Multiple Vitamin (MULTIVITAMIN WITH MINERALS) TABS tablet Take 1 tablet by mouth daily.     oxyCODONE-acetaminophen (PERCOCET) 10-325 MG tablet Take 1 tablet by mouth every 6 (six) hours as needed for pain.     Semaglutide, 2 MG/DOSE, (OZEMPIC, 2 MG/DOSE,) 8 MG/3ML SOPN Take 2 mg by mouth every Tuesday.     valsartan-hydrochlorothiazide (DIOVAN-HCT)  160-25 MG tablet Take 1 tablet by mouth daily.     No current facility-administered medications for this encounter.    Physical Findings: The patient is in no acute distress. Patient is alert and oriented.  height is 5\' 6"  (1.676 m) and weight is 209 lb 2 oz (94.9 kg). Her skin temperature is 97.3 F (36.3 C) (abnormal). Her blood pressure is 128/67 and her pulse is 75. Her respiration is 18 and oxygen saturation is 99%. .   Lungs are clear to auscultation bilaterally. Heart has regular rate and rhythm. No palpable cervical, supraclavicular, or axillary adenopathy. Abdomen soft, non-tender, normal bowel sounds.  Right Breast: no palpable mass, nipple discharge or bleeding. Left Breast: Hyperpigmentation changes noted throughout.  She continues to have significant edema within the breast.  She is  tender with palpation in the central portion of the breast.  Skin is healed well.  Lab Findings: Lab Results  Component Value Date   WBC 6.3 01/27/2023   HGB 14.5 01/27/2023   HCT 39.5 01/27/2023   MCV 81.4 01/27/2023   PLT 278 01/27/2023    Radiographic Findings: No results found.  Impression: Stage 0 (cTis (DCIS), cN0, cM0) Left Breast, Intermediate grade DCIS involving an intraductal papilloma, ER+ / PR+ / Her2 not assessed: s/p lumpectomy     The patient is recovering from the effects of radiation.  She continues to have swelling and discomfort after her radiation therapy.  We discussed that she may be a candidate for physical therapy.  She wishes to pursue this evaluation and will be set up with physical therapy for breast lymphedema issues.  Plan: Routine follow-up in 3 months.  Was also noted that the patient never started her adjuvant hormonal therapy and we have called the Dr. Remonia Richter office to get this initiated.   ___________________________________  Billie Lade, PhD, MD  This document serves as a record of services personally performed by Antony Blackbird, MD. It was created on his behalf by Neena Rhymes, a trained medical scribe. The creation of this record is based on the scribe's personal observations and the provider's statements to them. This document has been checked and approved by the attending provider.

## 2023-06-22 ENCOUNTER — Other Ambulatory Visit: Payer: Self-pay | Admitting: Hematology and Oncology

## 2023-06-22 MED ORDER — ANASTROZOLE 1 MG PO TABS: 1.0000 mg | ORAL_TABLET | Freq: Every day | ORAL | 3 refills | Status: DC

## 2023-06-22 NOTE — Progress Notes (Signed)
Anastrozole dispensed to pharmacy of her choice.  Kaitlyn Ramsey

## 2023-06-23 ENCOUNTER — Telehealth: Payer: Self-pay | Admitting: Hematology and Oncology

## 2023-06-23 NOTE — Telephone Encounter (Signed)
Spoke with patient confirming upcoming appointment  

## 2023-07-01 ENCOUNTER — Ambulatory Visit: Payer: Medicare HMO | Admitting: Rehabilitation

## 2023-07-16 NOTE — Therapy (Signed)
OUTPATIENT PHYSICAL THERAPY  UPPER EXTREMITY ONCOLOGY EVALUATION  Patient Name: Kaitlyn Ramsey MRN: [id number] DOB:December 17, 1955, 67 y.o., female Today's Date: 07/19/2023  END OF SESSION:  PT End of Session - 07/19/23 2158     Visit Number 1    Number of Visits 7    Date for PT Re-Evaluation 09/13/23    Authorization Type none needed    PT Start Time 1200    PT Stop Time 1249    PT Time Calculation (min) 49 min    Activity Tolerance Patient tolerated treatment well    Behavior During Therapy [facility] for tasks assessed/performed             Past Medical History:  Diagnosis Date   Anxiety    Dysrhythmia    Gastritis    History of radiation therapy    Left breast-03/31/23-04/29/23- Dr. Antony Blackbird   Hypertension    Past Surgical History:  Procedure Laterality Date   BREAST BIOPSY Left 01/15/2023   [place] LT BREAST BX W LOC DEV 1ST LESION IMG BX SPEC US GUIDE 01/15/2023 [facility]   BREAST BIOPSY  02/16/2023   MM LT RADIOACTIVE SEED LOC MAMMO GUIDE 02/16/2023 [facility]   BREAST LUMPECTOMY WITH RADIOACTIVE SEED LOCALIZATION Left 02/17/2023   Procedure: LEFT BREAST LUMPECTOMY WITH RADIOACTIVE SEED LOCALIZATION;  Surgeon: Manus Rudd, MD;  Location: [facility] OR;  Service: General;  Laterality: Left;  LMA   COLONOSCOPY     KNEE ARTHROSCOPY Left    Patient Active Problem List   Diagnosis Date Noted   Ductal carcinoma in situ (DCIS) of left breast 01/26/2023    PCP: Dr. Renaye Rakers  REFERRING PROVIDER: Dr. Antony Blackbird  REFERRING DIAG:  Diagnosis  D05.12 (ICD-10-CM) - Ductal carcinoma in situ (DCIS) of left breast    THERAPY DIAG:  Ductal carcinoma in situ (DCIS) of left breast  Lymphedema, not elsewhere classified  Disorder of the skin and subcutaneous tissue related to radiation, unspecified  ONSET DATE: 01/15/23  Rationale for Evaluation and Treatment: Rehabilitation  SUBJECTIVE:                                                                                                                                                                                            SUBJECTIVE STATEMENT: After surgery and radiation everything was fine and now the breast feels painful and heavy.  I have a compression bra but I haven't worn it for awhile.    PERTINENT HISTORY: Lt lumpectomy 02/17/23 due to DCIS - no nodes. Completed radiation. Bad Lt hip and LBP.  I see pain management for them.    PAIN:  Are you having pain? Yes - nothing when sitting but 9/10 when pressing on it NPRS scale: 9/10 Pain location: inside of the left breast  Pain orientation: Left  PAIN TYPE: burning and sharp Pain description: intermittent  Aggravating factors: touching it only Relieving factors: stop touching it  PRECAUTIONS: None  RED FLAGS: None   WEIGHT BEARING RESTRICTIONS: No  FALLS:  Has patient fallen in last 6 months? No  LIVING ENVIRONMENT: Lives with: lives with their family and lives with their daughter Lives in: House/apartment Stairs: No;  Has following equipment at home: none  OCCUPATION: retired   LEISURE: I don't do too much due to a bad Lt hip.    HAND DOMINANCE: right   PRIOR LEVEL OF FUNCTION: Independent  PATIENT GOALS: what do I do for the pain   OBJECTIVE: Note: Objective measures were completed at Evaluation unless otherwise noted.  COGNITION: Overall cognitive status: Within functional limits for tasks assessed   PALPATION: Mild fibrosis medial breast, +1-2 ttp with firmer touch - more than MLD pressure  OBSERVATIONS / OTHER ASSESSMENTS: Lt breast darker with post radiation changes.  Enlaged pores only noted in supinie.   BREAST COMPLAINTS QUESTIONNAIRE Pain:   9 Heaviness:  6 Swollen feeling: 5 Tense Skin:  0 Redness:  0 Bra Print:  0 Size of Pores:  0 Hard feeling:   5 Total:   25  /80 A Score over 9 indicates lymphedema issues in the breast     TODAY'S TREATMENT:                                                                                                                                           DATE: With pt permission for breast MLD Education on lymphatic anatomy and drainage patterns of the breast and principles of MLD Performed each step in supine per instruction section below with PT reading and then performing each step and then with hand over hand cueing and performance by pt with modification as needed - noted below. Then general MLD performed by PT.  Steps modified as follows: clavicular nodes, 5 deep breaths, bil axillary nodes, interaxillary anastamosis, then Lt breast towards nearest node bed. Did not use axilloinguinal as lymphatic system is intact.  encouraged compression bra use as much as possible as the most important component of decreasing breast edema.    PATIENT EDUCATION:  Education details: per today's note Person educated: Patient Education method: Explanation, Demonstration, Tactile cues, Verbal cues, and Handouts Education comprehension: verbalized understanding, returned demonstration, verbal cues required, tactile cues required, and needs further education  HOME EXERCISE PROGRAM: Self MLD  ASSESSMENT:  CLINICAL IMPRESSION: Patient is a 67 y.o. female who was seen today for physical therapy evaluation and treatment for her Lt breast lymphedema after radiation.  She has an intact lymphatic system but radiation induced damage and fluid stasis due to larger chest size.  She reports that it feels better even with short session today.  Her ROM is doing well and she reports no limitations overall.  Pt has not been wearing her compression bra since surgery so she will go back to this as much as possible.  .    OBJECTIVE IMPAIRMENTS: decreased knowledge of condition, decreased knowledge of use of DME, increased edema, and increased fascial restrictions.   ACTIVITY LIMITATIONS: none  PARTICIPATION LIMITATIONS: none  PERSONAL FACTORS: Age and 1 comorbidity: radiation hx   are also affecting patient's functional outcome.   REHAB POTENTIAL: Excellent  CLINICAL DECISION MAKING: Stable/uncomplicated  EVALUATION COMPLEXITY: Low  GOALS: Goals reviewed with patient? Yes    LONG TERM GOALS: Target date: 09/13/23  Pt will report pain with touch to the Left breast to 3/10 or less Baseline: 9/10 Goal status: INITIAL  2.  Pt will be ind with self MLD for the left breast Baseline:  Goal status: INITIAL  3.  Pt will decrease breast complaints questionnaire to 10 or less to demonstrate improved status  Baseline: 25 Goal status: INITIAL  PLAN:  PT FREQUENCY: 1x/week - pt preference  PT DURATION: 6 weeks  PLANNED INTERVENTIONS: 97164- PT Re-evaluation, 97110-Therapeutic exercises, 97535- Self Care, 24097- Manual therapy, Patient/Family education, Taping, Manual lymph drainage, and DME instructions  PLAN FOR NEXT SESSION: left breast MLD with education for self MLD:  See modified steps from eval day note  Idamae Lusher, PT 07/19/2023, 10:00 PM

## 2023-07-19 ENCOUNTER — Other Ambulatory Visit: Payer: Self-pay

## 2023-07-19 ENCOUNTER — Ambulatory Visit: Payer: Medicare HMO | Attending: Radiation Oncology | Admitting: Rehabilitation

## 2023-07-19 ENCOUNTER — Encounter: Payer: Self-pay | Admitting: Rehabilitation

## 2023-07-19 DIAGNOSIS — I890 Lymphedema, not elsewhere classified: Secondary | ICD-10-CM | POA: Diagnosis not present

## 2023-07-19 DIAGNOSIS — L599 Disorder of the skin and subcutaneous tissue related to radiation, unspecified: Secondary | ICD-10-CM | POA: Diagnosis not present

## 2023-07-19 DIAGNOSIS — D0512 Intraductal carcinoma in situ of left breast: Secondary | ICD-10-CM | POA: Insufficient documentation

## 2023-07-21 ENCOUNTER — Telehealth: Payer: Self-pay

## 2023-07-21 NOTE — Telephone Encounter (Signed)
Pt left message stating that she needs to cancel appt. on 12/4 with Dr. Al Pimple because she has another appt. On the same day. Sent a message to scheduling so they could reach out to her to reschedule her next appt.

## 2023-07-21 NOTE — Telephone Encounter (Signed)
Enter in error

## 2023-07-26 ENCOUNTER — Ambulatory Visit: Payer: Medicare HMO | Admitting: Physical Therapy

## 2023-07-27 ENCOUNTER — Encounter: Payer: Self-pay | Admitting: Physical Therapy

## 2023-08-04 ENCOUNTER — Ambulatory Visit: Payer: Medicare HMO | Admitting: Hematology and Oncology

## 2023-08-05 ENCOUNTER — Ambulatory Visit: Payer: Medicare HMO | Attending: Radiation Oncology | Admitting: Rehabilitation

## 2023-08-05 ENCOUNTER — Encounter: Payer: Self-pay | Admitting: Rehabilitation

## 2023-08-05 DIAGNOSIS — D0512 Intraductal carcinoma in situ of left breast: Secondary | ICD-10-CM | POA: Insufficient documentation

## 2023-08-05 DIAGNOSIS — I890 Lymphedema, not elsewhere classified: Secondary | ICD-10-CM | POA: Insufficient documentation

## 2023-08-05 DIAGNOSIS — L599 Disorder of the skin and subcutaneous tissue related to radiation, unspecified: Secondary | ICD-10-CM | POA: Insufficient documentation

## 2023-08-05 NOTE — Therapy (Signed)
OUTPATIENT PHYSICAL THERAPY  UPPER EXTREMITY ONCOLOGY TREATMENT  Patient Name: Kaitlyn Ramsey MRN: [id number] DOB:October 27, 1955, 67 y.o., female Today's Date: 08/05/2023  END OF SESSION:  PT End of Session - 08/05/23 1000     Visit Number 2    Number of Visits 7    Date for PT Re-Evaluation 09/13/23    PT Start Time 0900    PT Stop Time 0940    PT Time Calculation (min) 40 min    Activity Tolerance Patient tolerated treatment well    Behavior During Therapy [facility] for tasks assessed/performed              Past Medical History:  Diagnosis Date   Anxiety    Dysrhythmia    Gastritis    History of radiation therapy    Left breast-03/31/23-04/29/23- Dr. Antony Blackbird   Hypertension    Past Surgical History:  Procedure Laterality Date   BREAST BIOPSY Left 01/15/2023   [place] LT BREAST BX W LOC DEV 1ST LESION IMG BX SPEC US GUIDE 01/15/2023 [facility]   BREAST BIOPSY  02/16/2023   MM LT RADIOACTIVE SEED LOC MAMMO GUIDE 02/16/2023 [facility]   BREAST LUMPECTOMY WITH RADIOACTIVE SEED LOCALIZATION Left 02/17/2023   Procedure: LEFT BREAST LUMPECTOMY WITH RADIOACTIVE SEED LOCALIZATION;  Surgeon: Manus Rudd, MD;  Location: [facility] OR;  Service: General;  Laterality: Left;  LMA   COLONOSCOPY     KNEE ARTHROSCOPY Left    Patient Active Problem List   Diagnosis Date Noted   Ductal carcinoma in situ (DCIS) of left breast 01/26/2023    PCP: Dr. Renaye Rakers  REFERRING PROVIDER: Dr. Antony Blackbird  REFERRING DIAG:  Diagnosis  D05.12 (ICD-10-CM) - Ductal carcinoma in situ (DCIS) of left breast    THERAPY DIAG:  Ductal carcinoma in situ (DCIS) of left breast  Lymphedema, not elsewhere classified  Disorder of the skin and subcutaneous tissue related to radiation, unspecified  ONSET DATE: 01/15/23  Rationale for Evaluation and Treatment: Rehabilitation  SUBJECTIVE:                                                                                                                                                                                            SUBJECTIVE STATEMENT:  I have tried the massage but I think I slide too much.   EVAL: After surgery and radiation everything was fine and now the breast feels painful and heavy.  I have a compression bra but I haven't worn it for awhile.    PERTINENT HISTORY: Lt lumpectomy 02/17/23 due to DCIS - no nodes. Completed radiation. Bad Lt hip and LBP.  I  see pain management for them.    PAIN:  Are you having pain? Yes - nothing when sitting but 9/10 when pressing on it NPRS scale: 9/10 Pain location: inside of the left breast  Pain orientation: Left  PAIN TYPE: burning and sharp Pain description: intermittent  Aggravating factors: touching it only Relieving factors: stop touching it  PRECAUTIONS: None  RED FLAGS: None   WEIGHT BEARING RESTRICTIONS: No  FALLS:  Has patient fallen in last 6 months? No  LIVING ENVIRONMENT: Lives with: lives with their family and lives with their daughter Lives in: House/apartment Stairs: No;  Has following equipment at home: none  OCCUPATION: retired   LEISURE: I don't do too much due to a bad Lt hip.    HAND DOMINANCE: right   PRIOR LEVEL OF FUNCTION: Independent  PATIENT GOALS: what do I do for the pain   OBJECTIVE: Note: Objective measures were completed at Evaluation unless otherwise noted.  COGNITION: Overall cognitive status: Within functional limits for tasks assessed   PALPATION: Mild fibrosis medial breast, +1-2 ttp with firmer touch - more than MLD pressure  OBSERVATIONS / OTHER ASSESSMENTS: Lt breast darker with post radiation changes.  Enlaged pores only noted in supinie.   BREAST COMPLAINTS QUESTIONNAIRE Pain:   9 Heaviness:  6 Swollen feeling: 5 Tense Skin:  0 Redness:  0 Bra Print:  0 Size of Pores:  0 Hard feeling:   5 Total:   25  /80 A Score over 9 indicates lymphedema issues in the breast     TODAY'S TREATMENT:                                                                                                                                          Pt permission and consent throughout each step of examination and treatment with modification and draping if requested when working on sensitive areas  DATE:  08/05/23 Steps modified as follows: clavicular nodes, 5 deep breaths, bil axillary nodes, interaxillary anastamosis, then Lt breast towards nearest node bed. Did not use axilloinguinal as lymphatic system is intact.  encouraged compression bra use as much as possible as the most important component of decreasing breast edema.  Reviewed with hand over hand on the breast  EVAL: With pt permission for breast MLD Education on lymphatic anatomy and drainage patterns of the breast and principles of MLD Performed each step in supine per instruction section below with PT reading and then performing each step and then with hand over hand cueing and performance by pt with modification as needed - noted below. Then general MLD performed by PT.  Steps modified as follows: clavicular nodes, 5 deep breaths, bil axillary nodes, interaxillary anastamosis, then Lt breast towards nearest node bed. Did not use axilloinguinal as lymphatic system is intact.  encouraged compression bra use as much as possible as the most important component of decreasing breast edema.  PATIENT EDUCATION:  Education details: per today's note Person educated: Patient Education method: Programmer, multimedia, Demonstration, Tactile cues, Verbal cues, and Handouts Education comprehension: verbalized understanding, returned demonstration, verbal cues required, tactile cues required, and needs further education  HOME EXERCISE PROGRAM: Self MLD  ASSESSMENT:  CLINICAL IMPRESSION: Continued POC with left breast MLD - fibrosis lateral and medial inferior breast   OBJECTIVE IMPAIRMENTS: decreased knowledge of condition, decreased knowledge of use of DME, increased  edema, and increased fascial restrictions.   ACTIVITY LIMITATIONS: none  PARTICIPATION LIMITATIONS: none  PERSONAL FACTORS: Age and 1 comorbidity: radiation hx  are also affecting patient's functional outcome.   REHAB POTENTIAL: Excellent  CLINICAL DECISION MAKING: Stable/uncomplicated  EVALUATION COMPLEXITY: Low  GOALS: Goals reviewed with patient? Yes    LONG TERM GOALS: Target date: 09/13/23  Pt will report pain with touch to the Left breast to 3/10 or less Baseline: 9/10 Goal status: INITIAL  2.  Pt will be ind with self MLD for the left breast Baseline:  Goal status: INITIAL  3.  Pt will decrease breast complaints questionnaire to 10 or less to demonstrate improved status  Baseline: 25 Goal status: INITIAL  PLAN:  PT FREQUENCY: 1x/week - pt preference  PT DURATION: 6 weeks  PLANNED INTERVENTIONS: 97164- PT Re-evaluation, 97110-Therapeutic exercises, 97535- Self Care, 69629- Manual therapy, Patient/Family education, Taping, Manual lymph drainage, and DME instructions  PLAN FOR NEXT SESSION: left breast MLD with education for self MLD:  See modified steps from eval day note  Idamae Lusher, PT 08/05/2023, 10:01 AM

## 2023-08-11 ENCOUNTER — Encounter: Payer: Self-pay | Admitting: Rehabilitation

## 2023-08-11 ENCOUNTER — Ambulatory Visit: Payer: Medicare HMO | Admitting: Rehabilitation

## 2023-08-11 DIAGNOSIS — D0512 Intraductal carcinoma in situ of left breast: Secondary | ICD-10-CM | POA: Diagnosis not present

## 2023-08-11 DIAGNOSIS — I890 Lymphedema, not elsewhere classified: Secondary | ICD-10-CM

## 2023-08-11 DIAGNOSIS — L599 Disorder of the skin and subcutaneous tissue related to radiation, unspecified: Secondary | ICD-10-CM

## 2023-08-11 NOTE — Therapy (Signed)
OUTPATIENT PHYSICAL THERAPY  UPPER EXTREMITY ONCOLOGY TREATMENT  Patient Name: Kaitlyn Ramsey MRN: [id number] DOB:08/30/1956, 67 y.o., female Today's Date: 08/11/2023  END OF SESSION:  PT End of Session - 08/11/23 1247     Visit Number 3    Number of Visits 7    Date for PT Re-Evaluation 09/13/23    PT Start Time 1200    PT Stop Time 1246    PT Time Calculation (min) 46 min    Activity Tolerance Patient tolerated treatment well    Behavior During Therapy [facility] for tasks assessed/performed               Past Medical History:  Diagnosis Date   Anxiety    Dysrhythmia    Gastritis    History of radiation therapy    Left breast-03/31/23-04/29/23- Dr. Antony Blackbird   Hypertension    Past Surgical History:  Procedure Laterality Date   BREAST BIOPSY Left 01/15/2023   [place] LT BREAST BX W LOC DEV 1ST LESION IMG BX SPEC US GUIDE 01/15/2023 [facility]   BREAST BIOPSY  02/16/2023   MM LT RADIOACTIVE SEED LOC MAMMO GUIDE 02/16/2023 [facility]   BREAST LUMPECTOMY WITH RADIOACTIVE SEED LOCALIZATION Left 02/17/2023   Procedure: LEFT BREAST LUMPECTOMY WITH RADIOACTIVE SEED LOCALIZATION;  Surgeon: Manus Rudd, MD;  Location: [facility] OR;  Service: General;  Laterality: Left;  LMA   COLONOSCOPY     KNEE ARTHROSCOPY Left    Patient Active Problem List   Diagnosis Date Noted   Ductal carcinoma in situ (DCIS) of left breast 01/26/2023    PCP: Dr. Renaye Rakers  REFERRING PROVIDER: Dr. Antony Blackbird  REFERRING DIAG:  Diagnosis  D05.12 (ICD-10-CM) - Ductal carcinoma in situ (DCIS) of left breast    THERAPY DIAG:  Ductal carcinoma in situ (DCIS) of left breast  Lymphedema, not elsewhere classified  Disorder of the skin and subcutaneous tissue related to radiation, unspecified  ONSET DATE: 01/15/23  Rationale for Evaluation and Treatment: Rehabilitation  SUBJECTIVE:                                                                                                                                                                                            SUBJECTIVE STATEMENT:  I think I was more sore after last visit.   EVAL: After surgery and radiation everything was fine and now the breast feels painful and heavy.  I have a compression bra but I haven't worn it for awhile.    PERTINENT HISTORY: Lt lumpectomy 02/17/23 due to DCIS - no nodes. Completed radiation. Bad Lt hip and LBP.  I see pain  management for them.    PAIN:  Are you having pain? Yes - nothing when sitting but 6/10 when pressing on it NPRS scale: 6/10 Pain location: inside of the left breast  Pain orientation: Left  PAIN TYPE: burning and sharp Pain description: intermittent  Aggravating factors: touching it only Relieving factors: stop touching it  PRECAUTIONS: None  RED FLAGS: None   WEIGHT BEARING RESTRICTIONS: No  FALLS:  Has patient fallen in last 6 months? No  LIVING ENVIRONMENT: Lives with: lives with their family and lives with their daughter Lives in: House/apartment Stairs: No;  Has following equipment at home: none  OCCUPATION: retired   LEISURE: I don't do too much due to a bad Lt hip.    HAND DOMINANCE: right   PRIOR LEVEL OF FUNCTION: Independent  PATIENT GOALS: what do I do for the pain   OBJECTIVE: Note: Objective measures were completed at Evaluation unless otherwise noted.  COGNITION: Overall cognitive status: Within functional limits for tasks assessed   PALPATION: Mild fibrosis medial breast, +1-2 ttp with firmer touch - more than MLD pressure  OBSERVATIONS / OTHER ASSESSMENTS: Lt breast darker with post radiation changes.  Enlaged pores only noted in supinie.   BREAST COMPLAINTS QUESTIONNAIRE Pain:   9 Heaviness:  6 Swollen feeling: 5 Tense Skin:  0 Redness:  0 Bra Print:  0 Size of Pores:  0 Hard feeling:   5 Total:   25  /80 A Score over 9 indicates lymphedema issues in the breast     TODAY'S TREATMENT:                                                                                                                                          Pt permission and consent throughout each step of examination and treatment with modification and draping if requested when working on sensitive areas  DATE:  08/11/23 Performed by PT: Steps modified as follows: clavicular nodes, no abdominals due to tenderness per pt, 5 deep breaths, bil axillary nodes, interaxillary anastamosis, then Lt breast towards nearest node bed. Did not use axilloinguinal as lymphatic system is intact.  PROM Lt shoulder  08/05/23 Steps modified as follows: clavicular nodes, 5 deep breaths, bil axillary nodes, interaxillary anastamosis, then Lt breast towards nearest node bed. Did not use axilloinguinal as lymphatic system is intact.  encouraged compression bra use as much as possible as the most important component of decreasing breast edema.  Reviewed with hand over hand on the breast  EVAL: With pt permission for breast MLD Education on lymphatic anatomy and drainage patterns of the breast and principles of MLD Performed each step in supine per instruction section below with PT reading and then performing each step and then with hand over hand cueing and performance by pt with modification as needed - noted below. Then general MLD performed by PT.  Steps modified as follows: clavicular  nodes, 5 deep breaths, bil axillary nodes, interaxillary anastamosis, then Lt breast towards nearest node bed. Did not use axilloinguinal as lymphatic system is intact.  encouraged compression bra use as much as possible as the most important component of decreasing breast edema.    PATIENT EDUCATION:  Education details: per today's note Person educated: Patient Education method: Programmer, multimedia, Demonstration, Tactile cues, Verbal cues, and Handouts Education comprehension: verbalized understanding, returned demonstration, verbal cues required, tactile cues required, and needs  further education  HOME EXERCISE PROGRAM: Self MLD  ASSESSMENT:  CLINICAL IMPRESSION: Continued POC with left breast MLD - fibrosis lateral and medial inferior breast but improved since last visit.  Pt educated that soreness was probably just from working it out last time and how it is improved overall.  Pain has also decreased to 6/10    OBJECTIVE IMPAIRMENTS: decreased knowledge of condition, decreased knowledge of use of DME, increased edema, and increased fascial restrictions.   ACTIVITY LIMITATIONS: none  PARTICIPATION LIMITATIONS: none  PERSONAL FACTORS: Age and 1 comorbidity: radiation hx  are also affecting patient's functional outcome.   REHAB POTENTIAL: Excellent  CLINICAL DECISION MAKING: Stable/uncomplicated  EVALUATION COMPLEXITY: Low  GOALS: Goals reviewed with patient? Yes    LONG TERM GOALS: Target date: 09/13/23  Pt will report pain with touch to the Left breast to 3/10 or less Baseline: 9/10 Goal status: INITIAL  2.  Pt will be ind with self MLD for the left breast Baseline:  Goal status: INITIAL  3.  Pt will decrease breast complaints questionnaire to 10 or less to demonstrate improved status  Baseline: 25 Goal status: INITIAL  PLAN:  PT FREQUENCY: 1x/week - pt preference  PT DURATION: 6 weeks  PLANNED INTERVENTIONS: 97164- PT Re-evaluation, 97110-Therapeutic exercises, 97535- Self Care, 30160- Manual therapy, Patient/Family education, Taping, Manual lymph drainage, and DME instructions  PLAN FOR NEXT SESSION: left breast MLD with education for self MLD:  See modified steps from eval day note  Idamae Lusher, PT 08/11/2023, 12:47 PM

## 2023-08-18 ENCOUNTER — Ambulatory Visit: Payer: Medicare HMO | Admitting: Rehabilitation

## 2023-08-24 ENCOUNTER — Ambulatory Visit: Payer: Medicare HMO | Admitting: Rehabilitation

## 2023-08-24 ENCOUNTER — Encounter: Payer: Self-pay | Admitting: Rehabilitation

## 2023-08-24 DIAGNOSIS — D0512 Intraductal carcinoma in situ of left breast: Secondary | ICD-10-CM | POA: Diagnosis not present

## 2023-08-24 DIAGNOSIS — I890 Lymphedema, not elsewhere classified: Secondary | ICD-10-CM

## 2023-08-24 DIAGNOSIS — L599 Disorder of the skin and subcutaneous tissue related to radiation, unspecified: Secondary | ICD-10-CM

## 2023-08-24 NOTE — Therapy (Signed)
OUTPATIENT PHYSICAL THERAPY  UPPER EXTREMITY ONCOLOGY TREATMENT  Patient Name: Kaitlyn Ramsey MRN: [id number] DOB:Nov 30, 1955, 67 y.o., female Today's Date: 08/24/2023  END OF SESSION:  PT End of Session - 08/24/23 1045     Visit Number 4    Number of Visits 7    Date for PT Re-Evaluation 09/13/23    PT Start Time 1050    PT Stop Time 1130    PT Time Calculation (min) 40 min    Activity Tolerance Patient tolerated treatment well    Behavior During Therapy [facility] for tasks assessed/performed               Past Medical History:  Diagnosis Date   Anxiety    Dysrhythmia    Gastritis    History of radiation therapy    Left breast-03/31/23-04/29/23- Dr. Antony Blackbird   Hypertension    Past Surgical History:  Procedure Laterality Date   BREAST BIOPSY Left 01/15/2023   [place] LT BREAST BX W LOC DEV 1ST LESION IMG BX SPEC US GUIDE 01/15/2023 [facility]   BREAST BIOPSY  02/16/2023   MM LT RADIOACTIVE SEED LOC MAMMO GUIDE 02/16/2023 [facility]   BREAST LUMPECTOMY WITH RADIOACTIVE SEED LOCALIZATION Left 02/17/2023   Procedure: LEFT BREAST LUMPECTOMY WITH RADIOACTIVE SEED LOCALIZATION;  Surgeon: Manus Rudd, MD;  Location: [facility] OR;  Service: General;  Laterality: Left;  LMA   COLONOSCOPY     KNEE ARTHROSCOPY Left    Patient Active Problem List   Diagnosis Date Noted   Ductal carcinoma in situ (DCIS) of left breast 01/26/2023    PCP: Dr. Renaye Rakers  REFERRING PROVIDER: Dr. Antony Blackbird  REFERRING DIAG:  Diagnosis  D05.12 (ICD-10-CM) - Ductal carcinoma in situ (DCIS) of left breast    THERAPY DIAG:  Ductal carcinoma in situ (DCIS) of left breast  Lymphedema, not elsewhere classified  Disorder of the skin and subcutaneous tissue related to radiation, unspecified  ONSET DATE: 01/15/23  Rationale for Evaluation and Treatment: Rehabilitation  SUBJECTIVE:                                                                                                                                                                                            SUBJECTIVE STATEMENT:   It was sore again after last time.    EVAL: After surgery and radiation everything was fine and now the breast feels painful and heavy.  I have a compression bra but I haven't worn it for awhile.    PERTINENT HISTORY: Lt lumpectomy 02/17/23 due to DCIS - no nodes. Completed radiation. Bad Lt hip and LBP.  I see pain  management for them.    PAIN:  Are you having pain? Yes - nothing when sitting but 6/10 when pressing on it NPRS scale: 6/10 Pain location: inside of the left breast  Pain orientation: Left  PAIN TYPE: burning and sharp Pain description: intermittent  Aggravating factors: touching it only Relieving factors: stop touching it  PRECAUTIONS: None  RED FLAGS: None   WEIGHT BEARING RESTRICTIONS: No  FALLS:  Has patient fallen in last 6 months? No  LIVING ENVIRONMENT: Lives with: lives with their family and lives with their daughter Lives in: House/apartment Stairs: No;  Has following equipment at home: none  OCCUPATION: retired   LEISURE: I don't do too much due to a bad Lt hip.    HAND DOMINANCE: right   PRIOR LEVEL OF FUNCTION: Independent  PATIENT GOALS: what do I do for the pain   OBJECTIVE: Note: Objective measures were completed at Evaluation unless otherwise noted.  COGNITION: Overall cognitive status: Within functional limits for tasks assessed   PALPATION: Mild fibrosis medial breast, +1-2 ttp with firmer touch - more than MLD pressure  OBSERVATIONS / OTHER ASSESSMENTS: Lt breast darker with post radiation changes.  Enlaged pores only noted in supinie.   BREAST COMPLAINTS QUESTIONNAIRE Pain:   9 Heaviness:  6 Swollen feeling: 5 Tense Skin:  0 Redness:  0 Bra Print:  0 Size of Pores:  0 Hard feeling:   5 Total:   25  /80 A Score over 9 indicates lymphedema issues in the breast     TODAY'S TREATMENT:                                                                                                                                          Pt permission and consent throughout each step of examination and treatment with modification and draping if requested when working on sensitive areas  DATE:  08/24/23 Performed by PT: clavicular nodes, short neck, shoulder collectors no abdominals due to tenderness per pt, 5 deep breaths, bil axillary nodes, interaxillary anastamosis, added Lt inguinals and axillo inguinal anstamosis today to prioritize less breast work and more surrounding area.  then Lt breast towards nearest pathway.  PROM Lt shoulder  08/11/23 Performed by PT: Steps modified as follows: clavicular nodes, no abdominals due to tenderness per pt, 5 deep breaths, bil axillary nodes, interaxillary anastamosis, then Lt breast towards nearest node bed. Did not use axilloinguinal as lymphatic system is intact.  PROM Lt shoulder  08/05/23 Steps modified as follows: clavicular nodes, 5 deep breaths, bil axillary nodes, interaxillary anastamosis, then Lt breast towards nearest node bed. Did not use axilloinguinal as lymphatic system is intact.  encouraged compression bra use as much as possible as the most important component of decreasing breast edema.  Reviewed with hand over hand on the breast  EVAL: With pt permission for breast MLD Education on lymphatic anatomy and drainage patterns  of the breast and principles of MLD Performed each step in supine per instruction section below with PT reading and then performing each step and then with hand over hand cueing and performance by pt with modification as needed - noted below. Then general MLD performed by PT.  Steps modified as follows: clavicular nodes, 5 deep breaths, bil axillary nodes, interaxillary anastamosis, then Lt breast towards nearest node bed. Did not use axilloinguinal as lymphatic system is intact.  encouraged compression bra use as much as possible as the most  important component of decreasing breast edema.    PATIENT EDUCATION:  Education details: per today's note Person educated: Patient Education method: Explanation, Demonstration, Tactile cues, Verbal cues, and Handouts Education comprehension: verbalized understanding, returned demonstration, verbal cues required, tactile cues required, and needs further education  HOME EXERCISE PROGRAM: Self MLD  ASSESSMENT:  CLINICAL IMPRESSION: Continued POC with left breast MLD. Pt continues to note soreness after MLD sessions 2 and 3 but not after the first session where it felt much better, so we decreased time spent today aiming for around and added inguinal pathway for more surrounding work. - fibrosis lateral and medial inferior breast but improved since last visit.  Pt educated that soreness was probably just from working it out last time and how it is improved overall.  Pain has also decreased to 6/10    OBJECTIVE IMPAIRMENTS: decreased knowledge of condition, decreased knowledge of use of DME, increased edema, and increased fascial restrictions.   ACTIVITY LIMITATIONS: none  PARTICIPATION LIMITATIONS: none  PERSONAL FACTORS: Age and 1 comorbidity: radiation hx  are also affecting patient's functional outcome.   REHAB POTENTIAL: Excellent  CLINICAL DECISION MAKING: Stable/uncomplicated  EVALUATION COMPLEXITY: Low  GOALS: Goals reviewed with patient? Yes    LONG TERM GOALS: Target date: 09/13/23  Pt will report pain with touch to the Left breast to 3/10 or less Baseline: 9/10 Goal status: INITIAL  2.  Pt will be ind with self MLD for the left breast Baseline:  Goal status: INITIAL  3.  Pt will decrease breast complaints questionnaire to 10 or less to demonstrate improved status  Baseline: 25 Goal status: INITIAL  PLAN:  PT FREQUENCY: 1x/week - pt preference  PT DURATION: 6 weeks  PLANNED INTERVENTIONS: 97164- PT Re-evaluation, 97110-Therapeutic exercises, 97535-  Self Care, 32440- Manual therapy, Patient/Family education, Taping, Manual lymph drainage, and DME instructions  PLAN FOR NEXT SESSION: left breast MLD with education for self MLD:  See modified steps from eval day note  Idamae Lusher, PT 08/24/2023, 11:29 AM

## 2023-08-31 ENCOUNTER — Inpatient Hospital Stay: Payer: Medicare HMO | Admitting: Hematology and Oncology

## 2023-09-07 ENCOUNTER — Ambulatory Visit: Payer: Medicare HMO | Admitting: Rehabilitation

## 2023-09-09 ENCOUNTER — Encounter: Payer: Self-pay | Admitting: Rehabilitation

## 2023-09-09 ENCOUNTER — Ambulatory Visit: Payer: Medicare HMO | Attending: Radiation Oncology | Admitting: Rehabilitation

## 2023-09-09 DIAGNOSIS — L599 Disorder of the skin and subcutaneous tissue related to radiation, unspecified: Secondary | ICD-10-CM | POA: Diagnosis present

## 2023-09-09 DIAGNOSIS — D0512 Intraductal carcinoma in situ of left breast: Secondary | ICD-10-CM | POA: Diagnosis present

## 2023-09-09 DIAGNOSIS — I890 Lymphedema, not elsewhere classified: Secondary | ICD-10-CM | POA: Insufficient documentation

## 2023-09-09 NOTE — Therapy (Signed)
 OUTPATIENT PHYSICAL THERAPY  UPPER EXTREMITY ONCOLOGY TREATMENT  Patient Name: Kaitlyn Ramsey MRN: [id number] DOB:30-Jul-1956, 68 y.o., female Today's Date: 09/09/2023  END OF SESSION:  PT End of Session - 09/09/23 0954     Visit Number 5    Number of Visits 9    Date for PT Re-Evaluation 10/07/23    PT Start Time 0906    PT Stop Time 0955    PT Time Calculation (min) 49 min    Activity Tolerance Patient tolerated treatment well    Behavior During Therapy [facility] for tasks assessed/performed                Past Medical History:  Diagnosis Date   Anxiety    Dysrhythmia    Gastritis    History of radiation therapy    Left breast-03/31/23-04/29/23- Dr. Lynwood Nasuti   Hypertension    Past Surgical History:  Procedure Laterality Date   BREAST BIOPSY Left 01/15/2023   US  LT BREAST BX W LOC DEV 1ST LESION IMG BX SPEC US  GUIDE 01/15/2023 [facility]   BREAST BIOPSY  02/16/2023   MM LT RADIOACTIVE SEED LOC MAMMO GUIDE 02/16/2023 [facility]   BREAST LUMPECTOMY WITH RADIOACTIVE SEED LOCALIZATION Left 02/17/2023   Procedure: LEFT BREAST LUMPECTOMY WITH RADIOACTIVE SEED LOCALIZATION;  Surgeon: Belinda Cough, MD;  Location: [facility] OR;  Service: General;  Laterality: Left;  LMA   COLONOSCOPY     KNEE ARTHROSCOPY Left    Patient Active Problem List   Diagnosis Date Noted   Ductal carcinoma in situ (DCIS) of left breast 01/26/2023    PCP: Dr. Kennieth Leech  REFERRING PROVIDER: Dr. Lynwood Nasuti  REFERRING DIAG:  Diagnosis  D05.12 (ICD-10-CM) - Ductal carcinoma in situ (DCIS) of left breast    THERAPY DIAG:  Ductal carcinoma in situ (DCIS) of left breast  Lymphedema, not elsewhere classified  Disorder of the skin and subcutaneous tissue related to radiation, unspecified  ONSET DATE: 01/15/23  Rationale for Evaluation and Treatment: Rehabilitation  SUBJECTIVE:                                                                                                                                                                                            SUBJECTIVE STATEMENT:   It is not hurting as much now . I just don't like the bra    EVAL: After surgery and radiation everything was fine and now the breast feels painful and heavy.  I have a compression bra but I haven't worn it for awhile.    PERTINENT HISTORY: Lt lumpectomy 02/17/23 due to DCIS - no nodes. Completed radiation. Bad  Lt hip and LBP.  I see pain management for them.    PAIN:  Are you having pain? Yes - nothing when sitting but 3/10 when pressing on it NPRS scale: 3/10 Pain location: inside of the left breast  Pain orientation: Left  PAIN TYPE: burning and sharp Pain description: intermittent  Aggravating factors: touching it only Relieving factors: stop touching it  PRECAUTIONS: None  RED FLAGS: None   WEIGHT BEARING RESTRICTIONS: No  FALLS:  Has patient fallen in last 6 months? No  LIVING ENVIRONMENT: Lives with: lives with their family and lives with their daughter Lives in: House/apartment Stairs: No;  Has following equipment at home: none  OCCUPATION: retired   LEISURE: I don't do too much due to a bad Lt hip.    HAND DOMINANCE: right   PRIOR LEVEL OF FUNCTION: Independent  PATIENT GOALS: what do I do for the pain   OBJECTIVE: Note: Objective measures were completed at Evaluation unless otherwise noted.  COGNITION: Overall cognitive status: Within functional limits for tasks assessed   PALPATION: Mild fibrosis medial breast, +1-2 ttp with firmer touch - more than MLD pressure  OBSERVATIONS / OTHER ASSESSMENTS: Lt breast darker with post radiation changes.  Enlaged pores only noted in supinie.   BREAST COMPLAINTS QUESTIONNAIRE    EVAL  09/09/23 Pain:   9  3 Heaviness:  6  5 Swollen feeling: 5  5 Tense Skin:  0  0 Redness:  0  0 Bra Print:  0  0 Size of Pores:  0  0 Hard feeling:   5  5 Total:      25  /80  18/80 A Score over 9 indicates lymphedema  issues in the breast     TODAY'S TREATMENT:                                                                                                                                         Pt permission and consent throughout each step of examination and treatment with modification and draping if requested when working on sensitive areas  DATE:  09/09/23 Performed by PT: clavicular nodes, short neck, shoulder collectors no abdominals due to tenderness per pt, 5 deep breaths, bil axillary nodes, interaxillary anastamosis, added Lt inguinals and axillo inguinal anstamosis today to prioritize less breast work and more surrounding area.  then in sidelying posterior interaxillary work and then all steps in reverse.   PROM Lt shoulder  08/24/23 Performed by PT: clavicular nodes, short neck, shoulder collectors no abdominals due to tenderness per pt, 5 deep breaths, bil axillary nodes, interaxillary anastamosis, added Lt inguinals and axillo inguinal anstamosis today to prioritize less breast work and more surrounding area.  then Lt breast towards nearest pathway.  PROM Lt shoulder  08/11/23 Performed by PT: Steps modified as follows: clavicular nodes, no abdominals due to tenderness per pt, 5 deep breaths, bil axillary nodes,  interaxillary anastamosis, then Lt breast towards nearest node bed. Did not use axilloinguinal as lymphatic system is intact.  PROM Lt shoulder  08/05/23 Steps modified as follows: clavicular nodes, 5 deep breaths, bil axillary nodes, interaxillary anastamosis, then Lt breast towards nearest node bed. Did not use axilloinguinal as lymphatic system is intact.  encouraged compression bra use as much as possible as the most important component of decreasing breast edema.  Reviewed with hand over hand on the breast  EVAL: With pt permission for breast MLD Education on lymphatic anatomy and drainage patterns of the breast and principles of MLD Performed each step in supine per instruction  section below with PT reading and then performing each step and then with hand over hand cueing and performance by pt with modification as needed - noted below. Then general MLD performed by PT.  Steps modified as follows: clavicular nodes, 5 deep breaths, bil axillary nodes, interaxillary anastamosis, then Lt breast towards nearest node bed. Did not use axilloinguinal as lymphatic system is intact.  encouraged compression bra use as much as possible as the most important component of decreasing breast edema.    PATIENT EDUCATION:  Education details: per today's note Person educated: Patient Education method: Explanation, Demonstration, Tactile cues, Verbal cues, and Handouts Education comprehension: verbalized understanding, returned demonstration, verbal cues required, tactile cues required, and needs further education  HOME EXERCISE PROGRAM: Self MLD  ASSESSMENT:  CLINICAL IMPRESSION: Extended POC due to progress being made and missing a few visits. Continued POC with left breast MLD. Pain has decreased to 3/10 with pressing and 0/10 at rest and it feels overall softer today.   OBJECTIVE IMPAIRMENTS: decreased knowledge of condition, decreased knowledge of use of DME, increased edema, and increased fascial restrictions.   ACTIVITY LIMITATIONS: none  PARTICIPATION LIMITATIONS: none  PERSONAL FACTORS: Age and 1 comorbidity: radiation hx  are also affecting patient's functional outcome.   REHAB POTENTIAL: Excellent  CLINICAL DECISION MAKING: Stable/uncomplicated  EVALUATION COMPLEXITY: Low  GOALS: Goals reviewed with patient? Yes    LONG TERM GOALS: Target date: 10/10/23  Pt will report pain with touch to the Left breast to 3/10 or less Baseline: 9/10 Goal status: MET  2.  Pt will be ind with self MLD for the left breast Baseline:  Goal status: MET  3.  Pt will decrease breast complaints questionnaire to 10 or less to demonstrate improved status  Baseline: 25 Goal  status: PARTIALLY MET   PLAN:  PT FREQUENCY: 1x/week - pt preference  PT DURATION: 6 weeks  PLANNED INTERVENTIONS: 97164- PT Re-evaluation, 97110-Therapeutic exercises, 97535- Self Care, [id number]- Manual therapy, Patient/Family education, Taping, Manual lymph drainage, and DME instructions  PLAN FOR NEXT SESSION: left breast MLD with education for self MLD:  See modified steps from eval day note  Larue Saddie SAUNDERS, PT 09/09/2023, 9:55 AM

## 2023-09-15 ENCOUNTER — Ambulatory Visit: Payer: Medicare HMO | Admitting: Hematology and Oncology

## 2023-09-16 ENCOUNTER — Ambulatory Visit: Payer: Medicare HMO | Admitting: Rehabilitation

## 2023-09-17 ENCOUNTER — Ambulatory Visit: Payer: Medicare HMO | Admitting: Rehabilitation

## 2023-09-17 ENCOUNTER — Encounter: Payer: Self-pay | Admitting: Rehabilitation

## 2023-09-17 DIAGNOSIS — D0512 Intraductal carcinoma in situ of left breast: Secondary | ICD-10-CM | POA: Diagnosis not present

## 2023-09-17 DIAGNOSIS — I890 Lymphedema, not elsewhere classified: Secondary | ICD-10-CM

## 2023-09-17 DIAGNOSIS — L599 Disorder of the skin and subcutaneous tissue related to radiation, unspecified: Secondary | ICD-10-CM

## 2023-09-17 NOTE — Therapy (Signed)
OUTPATIENT PHYSICAL THERAPY  UPPER EXTREMITY ONCOLOGY TREATMENT  Patient Name: Kaitlyn Ramsey MRN: [id number] DOB:1956-07-21, 68 y.o., female Today's Date: 09/17/2023  END OF SESSION:  PT End of Session - 09/17/23 1144     Visit Number 6    Number of Visits 9    Date for PT Re-Evaluation 10/07/23    PT Start Time 1100    PT Stop Time 1145    PT Time Calculation (min) 45 min    Activity Tolerance Patient tolerated treatment well    Behavior During Therapy [facility] for tasks assessed/performed                 Past Medical History:  Diagnosis Date   Anxiety    Dysrhythmia    Gastritis    History of radiation therapy    Left breast-03/31/23-04/29/23- Dr. Antony Blackbird   Hypertension    Past Surgical History:  Procedure Laterality Date   BREAST BIOPSY Left 01/15/2023   [place] LT BREAST BX W LOC DEV 1ST LESION IMG BX SPEC US GUIDE 01/15/2023 [facility]   BREAST BIOPSY  02/16/2023   MM LT RADIOACTIVE SEED LOC MAMMO GUIDE 02/16/2023 [facility]   BREAST LUMPECTOMY WITH RADIOACTIVE SEED LOCALIZATION Left 02/17/2023   Procedure: LEFT BREAST LUMPECTOMY WITH RADIOACTIVE SEED LOCALIZATION;  Surgeon: Manus Rudd, MD;  Location: [facility] OR;  Service: General;  Laterality: Left;  LMA   COLONOSCOPY     KNEE ARTHROSCOPY Left    Patient Active Problem List   Diagnosis Date Noted   Ductal carcinoma in situ (DCIS) of left breast 01/26/2023    PCP: Dr. Renaye Rakers  REFERRING PROVIDER: Dr. Antony Blackbird  REFERRING DIAG:  Diagnosis  D05.12 (ICD-10-CM) - Ductal carcinoma in situ (DCIS) of left breast    THERAPY DIAG:  Lymphedema, not elsewhere classified  Ductal carcinoma in situ (DCIS) of left breast  Disorder of the skin and subcutaneous tissue related to radiation, unspecified  ONSET DATE: 01/15/23  Rationale for Evaluation and Treatment: Rehabilitation  SUBJECTIVE:                                                                                                                                                                                            SUBJECTIVE STATEMENT:   It only just sore to press on it now   EVAL: After surgery and radiation everything was fine and now the breast feels painful and heavy.  I have a compression bra but I haven't worn it for awhile.    PERTINENT HISTORY: Lt lumpectomy 02/17/23 due to DCIS - no nodes. Completed radiation. Bad Lt hip and LBP.  I see pain management for them.    PAIN:  Are you having pain? Yes - nothing when sitting but 3/10 when pressing on it NPRS scale: 3/10 Pain location: inside of the left breast  Pain orientation: Left  PAIN TYPE: burning and sharp Pain description: intermittent  Aggravating factors: touching it only Relieving factors: stop touching it  PRECAUTIONS: None  RED FLAGS: None   WEIGHT BEARING RESTRICTIONS: No  FALLS:  Has patient fallen in last 6 months? No  LIVING ENVIRONMENT: Lives with: lives with their family and lives with their daughter Lives in: House/apartment Stairs: No;  Has following equipment at home: none  OCCUPATION: retired   LEISURE: I don't do too much due to a bad Lt hip.    HAND DOMINANCE: right   PRIOR LEVEL OF FUNCTION: Independent  PATIENT GOALS: what do I do for the pain   OBJECTIVE: Note: Objective measures were completed at Evaluation unless otherwise noted.  COGNITION: Overall cognitive status: Within functional limits for tasks assessed   PALPATION: Mild fibrosis medial breast, +1-2 ttp with firmer touch - more than MLD pressure  OBSERVATIONS / OTHER ASSESSMENTS: Lt breast darker with post radiation changes.  Enlaged pores only noted in supinie.   BREAST COMPLAINTS QUESTIONNAIRE    EVAL  09/09/23 Pain:   9  3 Heaviness:  6  5 Swollen feeling: 5  5 Tense Skin:  0  0 Redness:  0  0 Bra Print:  0  0 Size of Pores:  0  0 Hard feeling:   5  5 Total:      25  /80  18/80 A Score over 9 indicates lymphedema issues in the  breast     TODAY'S TREATMENT:                                                                                                                                         Pt permission and consent throughout each step of examination and treatment with modification and draping if requested when working on sensitive areas  DATE:  09/17/23 Performed by PT: clavicular nodes, short neck, shoulder collectors no abdominals due to tenderness per pt, 5 deep breaths, bil axillary nodes, interaxillary anastamosis, added Lt inguinals and axillo inguinal anstamosis today to prioritize less breast work and more surrounding area.  then in sidelying posterior interaxillary work and then all steps in reverse.   PROM Lt shoulder  09/09/23 Performed by PT: clavicular nodes, short neck, shoulder collectors no abdominals due to tenderness per pt, 5 deep breaths, bil axillary nodes, interaxillary anastamosis, added Lt inguinals and axillo inguinal anstamosis today to prioritize less breast work and more surrounding area.  then in sidelying posterior interaxillary work and then all steps in reverse.   PROM Lt shoulder  08/24/23 Performed by PT: clavicular nodes, short neck, shoulder collectors no abdominals due to tenderness per pt, 5 deep breaths, bil  axillary nodes, interaxillary anastamosis, added Lt inguinals and axillo inguinal anstamosis today to prioritize less breast work and more surrounding area.  then Lt breast towards nearest pathway.  PROM Lt shoulder  08/11/23 Performed by PT: Steps modified as follows: clavicular nodes, no abdominals due to tenderness per pt, 5 deep breaths, bil axillary nodes, interaxillary anastamosis, then Lt breast towards nearest node bed. Did not use axilloinguinal as lymphatic system is intact.  PROM Lt shoulder  08/05/23 Steps modified as follows: clavicular nodes, 5 deep breaths, bil axillary nodes, interaxillary anastamosis, then Lt breast towards nearest node bed. Did not use  axilloinguinal as lymphatic system is intact.  encouraged compression bra use as much as possible as the most important component of decreasing breast edema.  Reviewed with hand over hand on the breast  EVAL: With pt permission for breast MLD Education on lymphatic anatomy and drainage patterns of the breast and principles of MLD Performed each step in supine per instruction section below with PT reading and then performing each step and then with hand over hand cueing and performance by pt with modification as needed - noted below. Then general MLD performed by PT.  Steps modified as follows: clavicular nodes, 5 deep breaths, bil axillary nodes, interaxillary anastamosis, then Lt breast towards nearest node bed. Did not use axilloinguinal as lymphatic system is intact.  encouraged compression bra use as much as possible as the most important component of decreasing breast edema.    PATIENT EDUCATION:  Education details: per today's note Person educated: Patient Education method: Explanation, Demonstration, Tactile cues, Verbal cues, and Handouts Education comprehension: verbalized understanding, returned demonstration, verbal cues required, tactile cues required, and needs further education  HOME EXERCISE PROGRAM: Self MLD  ASSESSMENT:  CLINICAL IMPRESSION: Continued POC with left breast MLD. Pain has decreased to 3/10 with pressing and 0/10 at rest and it feels overall softer today.   OBJECTIVE IMPAIRMENTS: decreased knowledge of condition, decreased knowledge of use of DME, increased edema, and increased fascial restrictions.   ACTIVITY LIMITATIONS: none  PARTICIPATION LIMITATIONS: none  PERSONAL FACTORS: Age and 1 comorbidity: radiation hx  are also affecting patient's functional outcome.   REHAB POTENTIAL: Excellent  CLINICAL DECISION MAKING: Stable/uncomplicated  EVALUATION COMPLEXITY: Low  GOALS: Goals reviewed with patient? Yes    LONG TERM GOALS: Target date:  10/10/23  Pt will report pain with touch to the Left breast to 3/10 or less Baseline: 9/10 Goal status: MET  2.  Pt will be ind with self MLD for the left breast Baseline:  Goal status: MET  3.  Pt will decrease breast complaints questionnaire to 10 or less to demonstrate improved status  Baseline: 25 Goal status: PARTIALLY MET   PLAN:  PT FREQUENCY: 1x/week - pt preference  PT DURATION: 6 weeks  PLANNED INTERVENTIONS: 97164- PT Re-evaluation, 97110-Therapeutic exercises, 97535- Self Care, 53664- Manual therapy, Patient/Family education, Taping, Manual lymph drainage, and DME instructions  PLAN FOR NEXT SESSION: left breast MLD with education for self MLD:  See modified steps from eval day note  Idamae Lusher, PT 09/17/2023, 11:45 AM

## 2023-09-21 NOTE — Progress Notes (Signed)
Radiation Oncology         [phone] ________________________________  Name: Kaitlyn Ramsey MRN: [id number]  Date: 09/23/2023  DOB: 12-21-1955  Follow-Up Visit Note  CC: Renaye Rakers, MD  Renaye Rakers, MD  No diagnosis found.  Diagnosis: Stage 0 (cTis (DCIS), cN0, cM0) Left Breast, Intermediate grade DCIS involving an intraductal papilloma, ER+ / PR+ / Her2 not assessed: s/p lumpectomy      Interval Since Last Radiation: 4 month and 24 days    Indication for treatment: Curative       Radiation treatment dates: 03/31/23 through 04/29/23  Site/dose:   1) Left breast 40.05 Gy delivered in 15 Fx at 2.67 Gy/Fx 2) Left breast boost - 12 Gy delivered in 6 Fx at 2.00 Gy/Fx Beams/energy: 10X-FFF    Narrative:  The patient returns today for routine follow-up. She was last seen here on 06-21-23. She was referred to PT Gwenevere Abbot on 07-19-23 to evaluate and treat her left breast lymphedema which occurred due to radiation. At that time, she was experiencing burning and sharp pain that is worsened by touch in her left breast. Patient was treated with breast MLD. She continues to follow up with PT to manage her symptoms. Her last visit with PT was on 09-17-23 in which her breast feel overall softer and her pain level decreased to 3 compared to 9 at initial consult.                  No other significant oncologic interval history since the patient was last seen.   Allergies:  is allergic to penicillins.  Meds: Current Outpatient Medications  Medication Sig Dispense Refill   amLODipine (NORVASC) 10 MG tablet Take 10 mg by mouth at bedtime.     anastrozole (ARIMIDEX) 1 MG tablet Take 1 tablet (1 mg total) by mouth daily. 90 tablet 3   atorvastatin (LIPITOR) 20 MG tablet Take 20 mg by mouth daily.     Biotin 69629 MCG TABS Take 10,000 mcg by mouth daily.     Calcium Citrate-Vitamin D (CALCIUM + D PO) Take 1 tablet by mouth daily.     Cholecalciferol (VITAMIN D3 MAXIMUM STRENGTH) 125 MCG  (5000 UT) capsule Take 5,000 Units by mouth daily.     doxycycline (VIBRA-TABS) 100 MG tablet Take 1 tablet (100 mg total) by mouth 2 (two) times daily. 14 tablet 0   gabapentin (NEURONTIN) 600 MG tablet Take 600 mg by mouth 3 (three) times daily.     metoprolol tartrate (LOPRESSOR) 25 MG tablet Take 25 mg by mouth 2 (two) times daily.     Milk Thistle 1000 MG CAPS Take 1,000 mg by mouth daily.     Multiple Vitamin (MULTIVITAMIN WITH MINERALS) TABS tablet Take 1 tablet by mouth daily.     oxyCODONE-acetaminophen (PERCOCET) 10-325 MG tablet Take 1 tablet by mouth every 6 (six) hours as needed for pain.     Semaglutide, 2 MG/DOSE, (OZEMPIC, 2 MG/DOSE,) 8 MG/3ML SOPN Take 2 mg by mouth every Tuesday.     valsartan-hydrochlorothiazide (DIOVAN-HCT) 160-25 MG tablet Take 1 tablet by mouth daily.     No current facility-administered medications for this encounter.    Physical Findings: The patient is in no acute distress. Patient is alert and oriented.  vitals were not taken for this visit. .  No significant changes. Lungs are clear to auscultation bilaterally. Heart has regular rate and rhythm. No palpable cervical, supraclavicular, or axillary adenopathy. Abdomen soft, non-tender, normal bowel sounds.  Lab Findings: Lab Results  Component Value Date   WBC 6.3 01/27/2023   HGB 14.5 01/27/2023   HCT 39.5 01/27/2023   MCV 81.4 01/27/2023   PLT 278 01/27/2023    Radiographic Findings: No results found.  Impression:  Stage 0 (cTis (DCIS), cN0, cM0) Left Breast, Intermediate grade DCIS involving an intraductal papilloma, ER+ / PR+ / Her2 not assessed: s/p lumpectomy      The patient is recovering from the effects of radiation.  ***  Plan:  ***   *** minutes of total time was spent for this patient encounter, including preparation, face-to-face counseling with the patient and coordination of care, physical exam, and documentation of the  encounter. ____________________________________  Billie Lade, PhD, MD  This document serves as a record of services personally performed by Antony Blackbird, MD. It was created on his behalf by Herbie Saxon, a trained medical scribe. The creation of this record is based on the scribe's personal observations and the provider's statements to them. This document has been checked and approved by the attending provider.

## 2023-09-22 ENCOUNTER — Ambulatory Visit: Payer: Medicare HMO | Admitting: Rehabilitation

## 2023-09-23 ENCOUNTER — Ambulatory Visit
Admission: RE | Admit: 2023-09-23 | Discharge: 2023-09-23 | Disposition: A | Discharge status: Home / Self Care | Payer: Medicare HMO | Source: Ambulatory Visit | Attending: Radiation Oncology | Admitting: Radiation Oncology

## 2023-09-23 ENCOUNTER — Encounter: Payer: Self-pay | Admitting: Radiation Oncology

## 2023-09-23 VITALS — BP 143/75 | HR 85 | Temp 97.5°F | Resp 20 | Ht 66.0 in | Wt 214.8 lb

## 2023-09-23 DIAGNOSIS — D0512 Intraductal carcinoma in situ of left breast: Secondary | ICD-10-CM

## 2023-09-23 NOTE — Progress Notes (Addendum)
Kaitlyn Ramsey is here today for follow up post radiation to the breast.   Breast Side:Left    They completed their radiation on: 04/29/2023  Does the patient complain of any of the following: Post radiation skin issues: Denies Breast Tenderness: Yes Breast Swelling: She reports the swelling is going down. Lymphadema: Yes Range of Motion limitations: Denies Fatigue post radiation: Yes Appetite good/fair/poor: Good   BP (!) 143/75 (BP Location: Right Arm, Patient Position: Sitting, Cuff Size: Large)   Pulse 85   Temp (!) 97.5 F (36.4 C)   Resp 20   Ht 5\' 6"  (1.676 m)   Wt 214 lb 12.8 oz (97.4 kg)   SpO2 97%   BMI 34.67 kg/m   Concerns: She would like a note stating that she can have dental work.

## 2023-09-29 ENCOUNTER — Encounter: Payer: Self-pay | Admitting: Rehabilitation

## 2023-09-29 ENCOUNTER — Ambulatory Visit: Payer: Medicare HMO | Admitting: Rehabilitation

## 2023-09-29 DIAGNOSIS — D0512 Intraductal carcinoma in situ of left breast: Secondary | ICD-10-CM | POA: Diagnosis not present

## 2023-09-29 DIAGNOSIS — I890 Lymphedema, not elsewhere classified: Secondary | ICD-10-CM

## 2023-09-29 DIAGNOSIS — L599 Disorder of the skin and subcutaneous tissue related to radiation, unspecified: Secondary | ICD-10-CM

## 2023-09-29 NOTE — Therapy (Signed)
OUTPATIENT PHYSICAL THERAPY  UPPER EXTREMITY ONCOLOGY TREATMENT  Patient Name: Kaitlyn Ramsey MRN: [id number] DOB:08-Jun-1956, 68 y.o., female Today's Date: 09/29/2023  END OF SESSION:  PT End of Session - 09/29/23 1158     Visit Number 7    Number of Visits 9    Date for PT Re-Evaluation 10/07/23    PT Start Time 1200    PT Stop Time 1245    PT Time Calculation (min) 45 min    Activity Tolerance Patient tolerated treatment well    Behavior During Therapy [facility] for tasks assessed/performed                 Past Medical History:  Diagnosis Date   Anxiety    Dysrhythmia    Gastritis    History of radiation therapy    Left breast-03/31/23-04/29/23- Dr. Antony Blackbird   Hypertension    Past Surgical History:  Procedure Laterality Date   BREAST BIOPSY Left 01/15/2023   [place] LT BREAST BX W LOC DEV 1ST LESION IMG BX SPEC US GUIDE 01/15/2023 [facility]   BREAST BIOPSY  02/16/2023   MM LT RADIOACTIVE SEED LOC MAMMO GUIDE 02/16/2023 [facility]   BREAST LUMPECTOMY WITH RADIOACTIVE SEED LOCALIZATION Left 02/17/2023   Procedure: LEFT BREAST LUMPECTOMY WITH RADIOACTIVE SEED LOCALIZATION;  Surgeon: Manus Rudd, MD;  Location: [facility] OR;  Service: General;  Laterality: Left;  LMA   COLONOSCOPY     KNEE ARTHROSCOPY Left    Patient Active Problem List   Diagnosis Date Noted   Ductal carcinoma in situ (DCIS) of left breast 01/26/2023    PCP: Dr. Renaye Rakers  REFERRING PROVIDER: Dr. Antony Blackbird  REFERRING DIAG:  Diagnosis  D05.12 (ICD-10-CM) - Ductal carcinoma in situ (DCIS) of left breast    THERAPY DIAG:  Lymphedema, not elsewhere classified  Ductal carcinoma in situ (DCIS) of left breast  Disorder of the skin and subcutaneous tissue related to radiation, unspecified  ONSET DATE: 01/15/23  Rationale for Evaluation and Treatment: Rehabilitation  SUBJECTIVE:                                                                                                                                                                                            SUBJECTIVE STATEMENT:  Nothing new.   EVAL: After surgery and radiation everything was fine and now the breast feels painful and heavy.  I have a compression bra but I haven't worn it for awhile.    PERTINENT HISTORY: Lt lumpectomy 02/17/23 due to DCIS - no nodes. Completed radiation. Bad Lt hip and LBP.  I see pain management for them.  PAIN:  Are you having pain? Yes - nothing when sitting but 3/10 when pressing on it NPRS scale: 3/10 Pain location: inside of the left breast  Pain orientation: Left  PAIN TYPE: burning and sharp Pain description: intermittent  Aggravating factors: touching it only Relieving factors: stop touching it  PRECAUTIONS: None  RED FLAGS: None   WEIGHT BEARING RESTRICTIONS: No  FALLS:  Has patient fallen in last 6 months? No  LIVING ENVIRONMENT: Lives with: lives with their family and lives with their daughter Lives in: House/apartment Stairs: No;  Has following equipment at home: none  OCCUPATION: retired   LEISURE: I don't do too much due to a bad Lt hip.    HAND DOMINANCE: right   PRIOR LEVEL OF FUNCTION: Independent  PATIENT GOALS: what do I do for the pain   OBJECTIVE: Note: Objective measures were completed at Evaluation unless otherwise noted.  COGNITION: Overall cognitive status: Within functional limits for tasks assessed   PALPATION: Mild fibrosis medial breast, +1-2 ttp with firmer touch - more than MLD pressure  OBSERVATIONS / OTHER ASSESSMENTS: Lt breast darker with post radiation changes.  Enlaged pores only noted in supinie.   BREAST COMPLAINTS QUESTIONNAIRE    EVAL  09/09/23 Pain:   9  3 Heaviness:  6  5 Swollen feeling: 5  5 Tense Skin:  0  0 Redness:  0  0 Bra Print:  0  0 Size of Pores:  0  0 Hard feeling:   5  5 Total:      25  /80  18/80 A Score over 9 indicates lymphedema issues in the breast     TODAY'S  TREATMENT:                                                                                                                                         Pt permission and consent throughout each step of examination and treatment with modification and draping if requested when working on sensitive areas  DATE:  09/29/23 Performed by PT: clavicular nodes, short neck, shoulder collectors no abdominals due to tenderness per pt, 5 deep breaths, bil axillary nodes, interaxillary anastamosis, added Lt inguinals and axillo inguinal anstamosis today to prioritize less breast work and more surrounding area.  then in sidelying posterior interaxillary work and then all steps in reverse.   PROM Lt shoulder  09/17/23 Performed by PT: clavicular nodes, short neck, shoulder collectors no abdominals due to tenderness per pt, 5 deep breaths, bil axillary nodes, interaxillary anastamosis, added Lt inguinals and axillo inguinal anstamosis today to prioritize less breast work and more surrounding area.  then in sidelying posterior interaxillary work and then all steps in reverse.   PROM Lt shoulder  09/09/23 Performed by PT: clavicular nodes, short neck, shoulder collectors no abdominals due to tenderness per pt, 5 deep breaths, bil axillary nodes, interaxillary anastamosis, added Lt inguinals and axillo  inguinal anstamosis today to prioritize less breast work and more surrounding area.  then in sidelying posterior interaxillary work and then all steps in reverse.   PROM Lt shoulder  08/24/23 Performed by PT: clavicular nodes, short neck, shoulder collectors no abdominals due to tenderness per pt, 5 deep breaths, bil axillary nodes, interaxillary anastamosis, added Lt inguinals and axillo inguinal anstamosis today to prioritize less breast work and more surrounding area.  then Lt breast towards nearest pathway.  PROM Lt shoulder  08/11/23 Performed by PT: Steps modified as follows: clavicular nodes, no abdominals due to  tenderness per pt, 5 deep breaths, bil axillary nodes, interaxillary anastamosis, then Lt breast towards nearest node bed. Did not use axilloinguinal as lymphatic system is intact.  PROM Lt shoulder  08/05/23 Steps modified as follows: clavicular nodes, 5 deep breaths, bil axillary nodes, interaxillary anastamosis, then Lt breast towards nearest node bed. Did not use axilloinguinal as lymphatic system is intact.  encouraged compression bra use as much as possible as the most important component of decreasing breast edema.  Reviewed with hand over hand on the breast  EVAL: With pt permission for breast MLD Education on lymphatic anatomy and drainage patterns of the breast and principles of MLD Performed each step in supine per instruction section below with PT reading and then performing each step and then with hand over hand cueing and performance by pt with modification as needed - noted below. Then general MLD performed by PT.  Steps modified as follows: clavicular nodes, 5 deep breaths, bil axillary nodes, interaxillary anastamosis, then Lt breast towards nearest node bed. Did not use axilloinguinal as lymphatic system is intact.  encouraged compression bra use as much as possible as the most important component of decreasing breast edema.    PATIENT EDUCATION:  Education details: per today's note Person educated: Patient Education method: Explanation, Demonstration, Tactile cues, Verbal cues, and Handouts Education comprehension: verbalized understanding, returned demonstration, verbal cues required, tactile cues required, and needs further education  HOME EXERCISE PROGRAM: Self MLD  ASSESSMENT:  CLINICAL IMPRESSION: Continued POC with left breast MLD.   OBJECTIVE IMPAIRMENTS: decreased knowledge of condition, decreased knowledge of use of DME, increased edema, and increased fascial restrictions.   ACTIVITY LIMITATIONS: none  PARTICIPATION LIMITATIONS: none  PERSONAL FACTORS:  Age and 1 comorbidity: radiation hx  are also affecting patient's functional outcome.   REHAB POTENTIAL: Excellent  CLINICAL DECISION MAKING: Stable/uncomplicated  EVALUATION COMPLEXITY: Low  GOALS: Goals reviewed with patient? Yes    LONG TERM GOALS: Target date: 10/10/23  Pt will report pain with touch to the Left breast to 3/10 or less Baseline: 9/10 Goal status: MET  2.  Pt will be ind with self MLD for the left breast Baseline:  Goal status: MET  3.  Pt will decrease breast complaints questionnaire to 10 or less to demonstrate improved status  Baseline: 25 Goal status: PARTIALLY MET   PLAN:  PT FREQUENCY: 1x/week - pt preference  PT DURATION: 6 weeks  PLANNED INTERVENTIONS: 97164- PT Re-evaluation, 97110-Therapeutic exercises, 97535- Self Care, 16109- Manual therapy, Patient/Family education, Taping, Manual lymph drainage, and DME instructions  PLAN FOR NEXT SESSION: left breast MLD with education for self MLD:  See modified steps from eval day note  Idamae Lusher, PT 09/29/2023, 12:45 PM

## 2023-10-05 ENCOUNTER — Telehealth: Payer: Self-pay

## 2023-10-05 ENCOUNTER — Inpatient Hospital Stay: Payer: Medicare HMO | Admitting: Adult Health

## 2023-10-05 ENCOUNTER — Encounter: Payer: Self-pay | Admitting: *Deleted

## 2023-10-05 NOTE — Progress Notes (Deleted)
 SURVIVORSHIP VISIT:  BRIEF ONCOLOGIC HISTORY:  Oncology History  Ductal carcinoma in situ (DCIS) of left breast  12/30/2022 Mammogram   Bilateral screening mammogram showed a possible asymmetry in the left breast.  Diagnostic mammogram showed suspicious 1 cm mass in the left breast at 2:00 2 cm from the nipple, lymph node with borderline cortical thickening in the left axilla   01/15/2023 Pathology Results   Left breast needle core biopsy at 2:00 showed DCIS, intermediate nuclear grade, cribriform type with necrosis, negative for invasive carcinoma.  Lymph node was benign and reactive.  Prognostic showed ER positive at 100% strong staining PR 95% positive strong staining   02/17/2023 Surgery   Left lumpectomy: DCIS, 1.9 cm, grade 2, no necrosis, margins negative   03/31/2023 - 04/29/2023 Radiation Therapy   Site/dose:   1) Left breast 40.05 Gy delivered in 15 Fx at 2.67 Gy/Fx 2) Left breast boost - 12 Gy delivered in 6 Fx at 2.00 Gy/Fx   07/2023 -  Anti-estrogen oral therapy   Anastrozole      INTERVAL HISTORY:  Kaitlyn Ramsey to review her survivorship care plan detailing her treatment course for breast cancer, as well as monitoring long-term side effects of that treatment, education regarding health maintenance, screening, and overall wellness and health promotion.     Overall, Kaitlyn Ramsey reports feeling quite well   REVIEW OF SYSTEMS:  Review of Systems  Constitutional:  Negative for appetite change, chills, fatigue, fever and unexpected weight change.  HENT:   Negative for hearing loss, lump/mass and trouble swallowing.   Eyes:  Negative for eye problems and icterus.  Respiratory:  Negative for chest tightness, cough and shortness of breath.   Cardiovascular:  Negative for chest pain, leg swelling and palpitations.  Gastrointestinal:  Negative for abdominal distention, abdominal pain, constipation, diarrhea, nausea and vomiting.  Endocrine: Negative for hot flashes.  Genitourinary:   Negative for difficulty urinating.   Musculoskeletal:  Negative for arthralgias.  Skin:  Negative for itching and rash.  Neurological:  Negative for dizziness, extremity weakness, headaches and numbness.  Hematological:  Negative for adenopathy. Does not bruise/bleed easily.  Psychiatric/Behavioral:  Negative for depression. The patient is not nervous/anxious.    Breast: Denies any new nodularity, masses, tenderness, nipple changes, or nipple discharge.       PAST MEDICAL/SURGICAL HISTORY:  Past Medical History:  Diagnosis Date   Anxiety    Dysrhythmia    Gastritis    History of radiation therapy    Left breast-03/31/23-04/29/23- Dr. Lynwood Nasuti   Hypertension    Past Surgical History:  Procedure Laterality Date   BREAST BIOPSY Left 01/15/2023   US  LT BREAST BX W LOC DEV 1ST LESION IMG BX SPEC US  GUIDE 01/15/2023 [facility]   BREAST BIOPSY  02/16/2023   MM LT RADIOACTIVE SEED LOC MAMMO GUIDE 02/16/2023 [facility]   BREAST LUMPECTOMY WITH RADIOACTIVE SEED LOCALIZATION Left 02/17/2023   Procedure: LEFT BREAST LUMPECTOMY WITH RADIOACTIVE SEED LOCALIZATION;  Surgeon: Belinda Cough, MD;  Location: [facility] OR;  Service: General;  Laterality: Left;  LMA   COLONOSCOPY     KNEE ARTHROSCOPY Left      ALLERGIES:  Allergies  Allergen Reactions   Penicillins Anaphylaxis and Shortness Of Breath     CURRENT MEDICATIONS:  Outpatient Encounter Medications as of 10/05/2023  Medication Sig Note   amLODipine (NORVASC) 10 MG tablet Take 10 mg by mouth at bedtime.    anastrozole  (ARIMIDEX ) 1 MG tablet Take 1 tablet (1 mg total) by  mouth daily.    atorvastatin (LIPITOR) 20 MG tablet Take 20 mg by mouth daily.    Biotin 89999 MCG TABS Take 10,000 mcg by mouth daily.    Calcium Citrate-Vitamin D (CALCIUM + D PO) Take 1 tablet by mouth daily.    Cholecalciferol (VITAMIN D3 MAXIMUM STRENGTH) 125 MCG (5000 UT) capsule Take 5,000 Units by mouth daily.    doxycycline  (VIBRA -TABS) 100 MG  tablet Take 1 tablet (100 mg total) by mouth 2 (two) times daily. (Patient not taking: Reported on 09/23/2023)    gabapentin (NEURONTIN) 600 MG tablet Take 600 mg by mouth 3 (three) times daily.    metoprolol  tartrate (LOPRESSOR ) 25 MG tablet Take 25 mg by mouth 2 (two) times daily.    Milk Thistle 1000 MG CAPS Take 1,000 mg by mouth daily.    Multiple Vitamin (MULTIVITAMIN WITH MINERALS) TABS tablet Take 1 tablet by mouth daily.    oxyCODONE -acetaminophen  (PERCOCET) 10-325 MG tablet Take 1 tablet by mouth every 6 (six) hours as needed for pain.    Semaglutide, 2 MG/DOSE, (OZEMPIC, 2 MG/DOSE,) 8 MG/3ML SOPN Take 2 mg by mouth every Tuesday. 02/04/2023: On hold for procedure   valsartan-hydrochlorothiazide (DIOVAN-HCT) 160-25 MG tablet Take 1 tablet by mouth daily.    No facility-administered encounter medications on file as of 10/05/2023.     ONCOLOGIC FAMILY HISTORY:  Family History  Problem Relation Age of Onset   Cancer Paternal Aunt        unknown type   Cancer Cousin        unknown type, paternal first cousin     SOCIAL HISTORY:  Social History   Socioeconomic History   Marital status: Single    Spouse name: Not on file   Number of children: Not on file   Years of education: Not on file   Highest education level: Not on file  Occupational History   Not on file  Tobacco Use   Smoking status: Former    Types: Cigarettes   Smokeless tobacco: Never  Vaping Use   Vaping status: Never Used  Substance and Sexual Activity   Alcohol use: Never   Drug use: Never   Sexual activity: Not on file  Other Topics Concern   Not on file  Social History Narrative   Not on file   Social Drivers of Health   Financial Resource Strain: Not on file  Food Insecurity: Food Insecurity Present (03/17/2023)   Hunger Vital Sign    Worried About Running Out of Food in the Last Year: Sometimes true    Ran Out of Food in the Last Year: Sometimes true  Transportation Needs: No Transportation  Needs (03/17/2023)   PRAPARE - Administrator, Civil Service (Medical): No    Lack of Transportation (Non-Medical): No  Physical Activity: Not on file  Stress: Not on file  Social Connections: Unknown (01/13/2022)   Received from [facility], [facility]   Social Network    Social Network: Not on file  Intimate Partner Violence: Not At Risk (03/17/2023)   Humiliation, Afraid, Rape, and Kick questionnaire    Fear of Current or Ex-Partner: No    Emotionally Abused: No    Physically Abused: No    Sexually Abused: No     OBSERVATIONS/OBJECTIVE:  There were no vitals taken for this visit. GENERAL: Patient is a well appearing female in no acute distress HEENT:  Sclerae anicteric.  Oropharynx clear and moist. No ulcerations or evidence of oropharyngeal candidiasis.  Neck is supple.  NODES:  No cervical, supraclavicular, or axillary lymphadenopathy palpated.  BREAST EXAM: left breast s/p lumpectomy and radiation, no sign of local recurrence, right breast benign LUNGS:  Clear to auscultation bilaterally.  No wheezes or rhonchi. HEART:  Regular rate and rhythm. No murmur appreciated. ABDOMEN:  Soft, nontender.  Positive, normoactive bowel sounds. No organomegaly palpated. MSK:  No focal spinal tenderness to palpation. Full range of motion bilaterally in the upper extremities. EXTREMITIES:  No peripheral edema.   SKIN:  Clear with no obvious rashes or skin changes. No nail dyscrasia. NEURO:  Nonfocal. Well oriented.  Appropriate affect.   LABORATORY DATA:  None for this visit.  DIAGNOSTIC IMAGING:  None for this visit.      ASSESSMENT AND PLAN:  Ms.. Ramsey is a pleasant 68 y.o. female with Stage 0 left breast DCIS, ER+/PR+, diagnosed in 12/2022, treated with lumpectomy, adjuvant radiation therapy, and anti-estrogen therapy with Anastrozole  beginning in 07/2023.  She presents to the Survivorship Clinic for our initial meeting and routine follow-up post-completion of  treatment for breast cancer.    1. Stage 0 left breast cancer:  Ms. Rollyson is continuing to recover from definitive treatment for breast cancer. She will follow-up with her medical oncologist, Dr.Iruku in 6 months with history and physical exam per surveillance protocol.  She will continue her anti-estrogen therapy with Anastrozole . Thus far, she is tolerating the Anastrozole  well, with minimal side effects. Her mammogram is due 12/2023; orders placed today.   Today, a comprehensive survivorship care plan and treatment summary was reviewed with the patient today detailing her breast cancer diagnosis, treatment course, potential late/long-term effects of treatment, appropriate follow-up care with recommendations for the future, and patient education resources.  A copy of this summary, along with a letter will be sent to the patient's primary care provider via mail/fax/In Basket message after today's visit.    #. Problem(s) at Visit______________  #. Bone health:  Given Ms. Gault's age/history of breast cancer and her current treatment regimen including anti-estrogen therapy with Anastrozole , she is at risk for bone demineralization. She has bone density testing scheduled on 12/28/2023.SABRA  She was given education on specific activities to promote bone health.  #. Cancer screening:  Due to Ms. Innocent's history and her age, she should receive screening for skin cancers, colon cancer, and gynecologic cancers.  The information and recommendations are listed on the patient's comprehensive care plan/treatment summary and were reviewed in detail with the patient.    #. Health maintenance and wellness promotion: Ms. Vanorder was encouraged to consume 5-7 servings of fruits and vegetables per day. We reviewed the [employer] handout.  She was also encouraged to engage in moderate to vigorous exercise for 30 minutes per day most days of the week.  She was instructed to limit her alcohol consumption and  continue to abstain from tobacco use/***was encouraged stop smoking.     #. Support services/counseling: It is not uncommon for this period of the patient's cancer care trajectory to be one of many emotions and stressors.   She was given information regarding our available services and encouraged to contact me with any questions or for help enrolling in any of our support group/programs.    Follow up instructions:    -Return to cancer center ***  -Mammogram due in *** -She is welcome to return back to the Survivorship Clinic at any time; no additional follow-up needed at this time.  -Consider referral back to survivorship as a  long-term survivor for continued surveillance  The patient was provided an opportunity to ask questions and all were answered. The patient agreed with the plan and demonstrated an understanding of the instructions.   Total encounter time:*** minutes*in face-to-face visit time, chart review, lab review, care coordination, order entry, and documentation of the encounter time.    Morna Kendall, NP 10/05/23 1:02 PM Medical Oncology and Hematology [facility] [address] [place], [place] [address] Tel. [phone]    Fax. [phone]  *Total Encounter Time as defined by the Centers for Medicare and Medicaid Services includes, in addition to the face-to-face time of a patient visit (documented in the note above) non-face-to-face time: obtaining and reviewing outside history, ordering and reviewing medications, tests or procedures, care coordination (communications with other health care professionals or caregivers) and documentation in the medical record.

## 2023-10-05 NOTE — Telephone Encounter (Signed)
Rescheduled pt appt from 2/4 to 2/10 due to her mother been admitted to the hospital. Called pt with new scheduled appt pt verbalized understanding.

## 2023-10-06 ENCOUNTER — Encounter: Payer: Medicare HMO | Admitting: Rehabilitation

## 2023-10-07 ENCOUNTER — Ambulatory Visit: Payer: Medicare HMO | Admitting: Rehabilitation

## 2023-10-11 ENCOUNTER — Inpatient Hospital Stay: Payer: Medicare HMO | Attending: Adult Health | Admitting: Adult Health

## 2023-10-11 ENCOUNTER — Encounter: Payer: Self-pay | Admitting: Adult Health

## 2023-10-11 VITALS — BP 138/59 | HR 71 | Temp 97.8°F | Resp 18 | Ht 66.0 in | Wt 206.0 lb

## 2023-10-11 DIAGNOSIS — E669 Obesity, unspecified: Secondary | ICD-10-CM | POA: Insufficient documentation

## 2023-10-11 DIAGNOSIS — D0512 Intraductal carcinoma in situ of left breast: Secondary | ICD-10-CM | POA: Diagnosis present

## 2023-10-11 DIAGNOSIS — M797 Fibromyalgia: Secondary | ICD-10-CM | POA: Insufficient documentation

## 2023-10-11 NOTE — Progress Notes (Signed)
[facility] Cancer Follow up:    Renaye Rakers, MD [address] [place] [address]   DIAGNOSIS:  Cancer Staging  Ductal carcinoma in situ (DCIS) of left breast Staging form: Breast, AJCC 8th Edition - Clinical stage from 01/27/2023: Stage 0 (cTis (DCIS), cN0, cM0, ER+, PR+, HER2: Not Assessed) - Unsigned Stage prefix: Initial diagnosis Nuclear grade: G2 Laterality: Left Staged by: Pathologist and managing physician Stage used in treatment planning: Yes National guidelines used in treatment planning: Yes Type of national guideline used in treatment planning: NCCN   SUMMARY OF ONCOLOGIC HISTORY: Oncology History  Ductal carcinoma in situ (DCIS) of left breast  12/30/2022 Mammogram   Bilateral screening mammogram showed a possible asymmetry in the left breast.  Diagnostic mammogram showed suspicious 1 cm mass in the left breast at 2:00 2 cm from the nipple, lymph node with borderline cortical thickening in the left axilla   01/15/2023 Pathology Results   Left breast needle core biopsy at 2:00 showed DCIS, intermediate nuclear grade, cribriform type with necrosis, negative for invasive carcinoma.  Lymph node was benign and reactive.  Prognostic showed ER positive at 100% strong staining PR 95% positive strong staining   02/17/2023 Surgery   Left lumpectomy: DCIS, 1.9 cm, grade 2, no necrosis, margins negative   03/31/2023 - 04/29/2023 Radiation Therapy   Site/dose:   1) Left breast 40.05 Gy delivered in 15 Fx at 2.67 Gy/Fx 2) Left breast boost - 12 Gy delivered in 6 Fx at 2.00 Gy/Fx   07/2023 -  Anti-estrogen oral therapy   Anastrozole     CURRENT THERAPY: Anastrozole  INTERVAL HISTORY:  Discussed the use of AI scribe software for clinical note transcription with the patient, who gave verbal consent to proceed.  Kaitlyn Ramsey 68 y.o. female a breast cancer survivor, presents for follow up after completing surgery, radiation, and initiation of  anastrozole. She reports fatigue and stress, but is unsure if these symptoms are related to the anastrozole or other medications. She denies any alarming symptoms or side effects from the anastrozole.  She is currently undergoing therapy for her breast and reports a decrease in radiation fluid. She has a primary care provider and recently had an appointment. She is in need of a Pap smear but has yet to find a provider for this service. She reports a sensation in her stomach when she first started the anastrozole, similar to the onset of her period, but this has since resolved.   Patient Active Problem List   Diagnosis Date Noted   Obesity 10/11/2023   Fibromyalgia 10/11/2023   Ductal carcinoma in situ (DCIS) of left breast 01/26/2023   Osteoarthritis 12/30/2017   Hypertension 12/30/2017   Acute depression 12/30/2017    is allergic to penicillins.  MEDICAL HISTORY: Past Medical History:  Diagnosis Date   Anxiety    Dysrhythmia    Gastritis    History of radiation therapy    Left breast-03/31/23-04/29/23- Dr. Antony Blackbird   Hypertension     SURGICAL HISTORY: Past Surgical History:  Procedure Laterality Date   BREAST BIOPSY Left 01/15/2023   [place] LT BREAST BX W LOC DEV 1ST LESION IMG BX SPEC US GUIDE 01/15/2023 [facility]   BREAST BIOPSY  02/16/2023   MM LT RADIOACTIVE SEED LOC MAMMO GUIDE 02/16/2023 [facility]MO[facility]   BREAST LUMPECTOMY WITH RADIOACTIVE SEED LOCALIZATION Left 02/17/2023   Procedure: LEFT BREAST LUMPECTOMY WITH RADIOACTIVE SEED LOCALIZATION;  Surgeon: Manus Rudd, MD;  Location: [facility]  OR;  Service: General;  Laterality: Left;  LMA   COLONOSCOPY     KNEE ARTHROSCOPY Left     SOCIAL HISTORY: Social History   Socioeconomic History   Marital status: Single    Spouse name: Not on file   Number of children: Not on file   Years of education: Not on file   Highest education level: Not on file  Occupational History   Not on file  Tobacco Use   Smoking  status: Former    Types: Cigarettes   Smokeless tobacco: Never  Vaping Use   Vaping status: Never Used  Substance and Sexual Activity   Alcohol use: Never   Drug use: Never   Sexual activity: Not on file  Other Topics Concern   Not on file  Social History Narrative   Not on file   Social Drivers of Health   Financial Resource Strain: Not on file  Food Insecurity: Food Insecurity Present (03/17/2023)   Hunger Vital Sign    Worried About Running Out of Food in the Last Year: Sometimes true    Ran Out of Food in the Last Year: Sometimes true  Transportation Needs: No Transportation Needs (03/17/2023)   PRAPARE - Administrator, Civil Service (Medical): No    Lack of Transportation (Non-Medical): No  Physical Activity: Not on file  Stress: Not on file  Social Connections: Unknown (01/13/2022)   Received from [facility]   Social Network    Social Network: Not on file  Intimate Partner Violence: Not At Risk (03/17/2023)   Humiliation, Afraid, Rape, and Kick questionnaire    Fear of Current or Ex-Partner: No    Emotionally Abused: No    Physically Abused: No    Sexually Abused: No    FAMILY HISTORY: Family History  Problem Relation Age of Onset   Cancer Paternal Aunt        unknown type   Cancer Cousin        unknown type, paternal first cousin    Review of Systems  Constitutional:  Positive for fatigue. Negative for appetite change, chills, fever and unexpected weight change.  HENT:   Negative for hearing loss, lump/mass and trouble swallowing.   Eyes:  Negative for eye problems and icterus.  Respiratory:  Negative for chest tightness, cough and shortness of breath.   Cardiovascular:  Negative for chest pain, leg swelling and palpitations.  Gastrointestinal:  Negative for abdominal distention, abdominal pain, constipation, diarrhea, nausea and vomiting.  Endocrine: Negative for hot flashes.  Genitourinary:  Negative for difficulty urinating.    Musculoskeletal:  Negative for arthralgias.  Skin:  Negative for itching and rash.  Neurological:  Negative for dizziness, extremity weakness, headaches and numbness.  Hematological:  Negative for adenopathy. Does not bruise/bleed easily.  Psychiatric/Behavioral:  Negative for depression. The patient is not nervous/anxious.       PHYSICAL EXAMINATION    Vitals:   10/11/23 1038  BP: (!) 138/59  Pulse: 71  Resp: 18  Temp: 97.8 F (36.6 C)  SpO2: 99%    Physical Exam Constitutional:      General: She is not in acute distress.    Appearance: Normal appearance. She is not toxic-appearing.  HENT:     Head: Normocephalic and atraumatic.     Mouth/Throat:     Mouth: Mucous membranes are moist.     Pharynx: Oropharynx is clear. No oropharyngeal exudate or posterior oropharyngeal erythema.  Eyes:     General:  No scleral icterus. Cardiovascular:     Rate and Rhythm: Normal rate and regular rhythm.     Pulses: Normal pulses.     Heart sounds: Normal heart sounds.  Pulmonary:     Effort: Pulmonary effort is normal.     Breath sounds: Normal breath sounds.  Chest:     Comments: Left breast s/p lumpectomy and radiation, no sign of local recurrence, right breast is benign Abdominal:     General: Abdomen is flat. Bowel sounds are normal. There is no distension.     Palpations: Abdomen is soft.     Tenderness: There is no abdominal tenderness.  Musculoskeletal:        General: No swelling.     Cervical back: Neck supple.  Lymphadenopathy:     Cervical: No cervical adenopathy.     Upper Body:     Right upper body: No axillary adenopathy.     Left upper body: No axillary adenopathy.  Skin:    General: Skin is warm and dry.     Findings: No rash.  Neurological:     General: No focal deficit present.     Mental Status: She is alert.  Psychiatric:        Mood and Affect: Mood normal.        Behavior: Behavior normal.     ASSESSMENT and THERAPY PLAN:   Ductal carcinoma  in situ (DCIS) of left breast Kaitlyn Ramsey is a 68 year old woman with stage 0 breast cancer s/p lumpectomy and radiation now on antiestrogen therapy with Anastrozole daily.    Breast Cancer Post-surgical and radiation therapy, currently on Anastrozole with reported fatigue and abdominal discomfort. No new breast changes. Undergoing therapy for radiation-induced breast lymphedema. -Continue Anastrozole. -Plan for survivorship visit in three months.  General Health Maintenance -Next mammogram due in May 2025. -Bone density testing scheduled for April 2025. -Identify a provider for Pap smear. -Follow-up with Dr. Al Pimple in six months.  All questions were answered. The patient knows to call the clinic with any problems, questions or concerns. We can certainly see the patient much sooner if necessary.  Total encounter time:30 minutes*in face-to-face visit time, chart review, lab review, care coordination, order entry, and documentation of the encounter time.  Lillard Anes, NP 10/13/23 8:58 AM Medical Oncology and Hematology [facility] [address] [place], [place] [address] Tel. [phone]    Fax. [phone]  *Total Encounter Time as defined by the Centers for Medicare and Medicaid Services includes, in addition to the face-to-face time of a patient visit (documented in the note above) non-face-to-face time: obtaining and reviewing outside history, ordering and reviewing medications, tests or procedures, care coordination (communications with other health care professionals or caregivers) and documentation in the medical record.

## 2023-10-13 ENCOUNTER — Ambulatory Visit: Payer: Medicare HMO | Admitting: Rehabilitation

## 2023-10-13 NOTE — Assessment & Plan Note (Signed)
Kaitlyn Ramsey is a 68 year old woman with stage 0 breast cancer s/p lumpectomy and radiation now on antiestrogen therapy with Anastrozole daily.    Breast Cancer Post-surgical and radiation therapy, currently on Anastrozole with reported fatigue and abdominal discomfort. No new breast changes. Undergoing therapy for radiation-induced breast lymphedema. -Continue Anastrozole. -Plan for survivorship visit in three months.  General Health Maintenance -Next mammogram due in May 2025. -Bone density testing scheduled for April 2025. -Identify a provider for Pap smear. -Follow-up with Dr. Al Pimple in six months.

## 2023-10-22 ENCOUNTER — Encounter: Payer: Self-pay | Admitting: Rehabilitation

## 2023-10-22 ENCOUNTER — Ambulatory Visit: Payer: Medicare HMO | Attending: Radiation Oncology | Admitting: Rehabilitation

## 2023-10-22 DIAGNOSIS — L599 Disorder of the skin and subcutaneous tissue related to radiation, unspecified: Secondary | ICD-10-CM | POA: Insufficient documentation

## 2023-10-22 DIAGNOSIS — D0512 Intraductal carcinoma in situ of left breast: Secondary | ICD-10-CM | POA: Diagnosis present

## 2023-10-22 DIAGNOSIS — I890 Lymphedema, not elsewhere classified: Secondary | ICD-10-CM | POA: Insufficient documentation

## 2023-10-22 NOTE — Therapy (Signed)
 OUTPATIENT PHYSICAL THERAPY  UPPER EXTREMITY ONCOLOGY TREATMENT  Patient Name: Kaitlyn Ramsey MRN: [id number] DOB:28-Oct-1955, 68 y.o., female Today's Date: 10/22/2023  END OF SESSION:  PT End of Session - 10/22/23 1002     Visit Number 8    Number of Visits 12    Date for PT Re-Evaluation 11/19/23    PT Start Time 1002    PT Stop Time 1051    PT Time Calculation (min) 49 min    Activity Tolerance Patient tolerated treatment well    Behavior During Therapy WFL for tasks assessed/performed                 Past Medical History:  Diagnosis Date   Anxiety    Dysrhythmia    Gastritis    History of radiation therapy    Left breast-03/31/23-04/29/23- Dr. Antony Blackbird   Hypertension    Past Surgical History:  Procedure Laterality Date   BREAST BIOPSY Left 01/15/2023   [place] LT BREAST BX W LOC DEV 1ST LESION IMG BX SPEC US GUIDE 01/15/2023 [facility]   BREAST BIOPSY  02/16/2023   MM LT RADIOACTIVE SEED LOC MAMMO GUIDE 02/16/2023 [facility]   BREAST LUMPECTOMY WITH RADIOACTIVE SEED LOCALIZATION Left 02/17/2023   Procedure: LEFT BREAST LUMPECTOMY WITH RADIOACTIVE SEED LOCALIZATION;  Surgeon: Manus Rudd, MD;  Location: [facility] OR;  Service: General;  Laterality: Left;  LMA   COLONOSCOPY     KNEE ARTHROSCOPY Left    Patient Active Problem List   Diagnosis Date Noted   Obesity 10/11/2023   Fibromyalgia 10/11/2023   Ductal carcinoma in situ (DCIS) of left breast 01/26/2023   Osteoarthritis 12/30/2017   Hypertension 12/30/2017   Acute depression 12/30/2017    PCP: Dr. Renaye Rakers  REFERRING PROVIDER: Dr. Antony Blackbird  REFERRING DIAG:  Diagnosis  D05.12 (ICD-10-CM) - Ductal carcinoma in situ (DCIS) of left breast    THERAPY DIAG:  Lymphedema, not elsewhere classified  Ductal carcinoma in situ (DCIS) of left breast  Disorder of the skin and subcutaneous tissue related to radiation, unspecified  ONSET DATE: 01/15/23  Rationale for Evaluation and  Treatment: Rehabilitation  SUBJECTIVE:                                                                                                                                                                                           SUBJECTIVE STATEMENT:  Its so much better.  But I will want to do more    EVAL: After surgery and radiation everything was fine and now the breast feels painful and heavy.  I have a compression bra but I haven't worn it  for awhile.    PERTINENT HISTORY: Lt lumpectomy 02/17/23 due to DCIS - no nodes. Completed radiation. Bad Lt hip and LBP.  I see pain management for them.    PAIN:  Are you having pain? Yes - nothing when sitting but 3/10 when pressing on it NPRS scale: 3/10 Pain location: inside of the left breast  Pain orientation: Left  PAIN TYPE: burning and sharp Pain description: intermittent  Aggravating factors: touching it only Relieving factors: stop touching it  PRECAUTIONS: None  RED FLAGS: None   WEIGHT BEARING RESTRICTIONS: No  FALLS:  Has patient fallen in last 6 months? No  LIVING ENVIRONMENT: Lives with: lives with their family and lives with their daughter Lives in: House/apartment Stairs: No;  Has following equipment at home: none  OCCUPATION: retired   LEISURE: I don't do too much due to a bad Lt hip.    HAND DOMINANCE: right   PRIOR LEVEL OF FUNCTION: Independent  PATIENT GOALS: what do I do for the pain   OBJECTIVE: Note: Objective measures were completed at Evaluation unless otherwise noted.  COGNITION: Overall cognitive status: Within functional limits for tasks assessed   PALPATION: Mild fibrosis medial breast, +1-2 ttp with firmer touch - more than MLD pressure  OBSERVATIONS / OTHER ASSESSMENTS: Lt breast darker with post radiation changes.  Enlaged pores only noted in supinie.   BREAST COMPLAINTS QUESTIONNAIRE    EVAL  09/09/23  10/22/23  Pain:   9  3  3  Heaviness:  6  5  2  Swollen feeling: 5  5  3  Tense  Skin:  0  0  0 Redness:  0  0  0 Bra Print:  0  0  0 Size of Pores:  0  0  0 Hard feeling:   5  5  3  Total:      25  /80  18/80  11/80 A Score over 9 indicates lymphedema issues in the breast     TODAY'S TREATMENT:                                                                                                                                         Pt permission and consent throughout each step of examination and treatment with modification and draping if requested when working on sensitive areas  DATE:  10/22/23 Performed by PT: clavicular nodes, short neck, shoulder collectors no abdominals due to tenderness per pt, 5 deep breaths, bil axillary nodes, interaxillary anastamosis, added Lt inguinals and axillo inguinal anstamosis today to prioritize less breast work and more surrounding area.  then in sidelying posterior interaxillary work and then all steps in reverse.   PROM Lt shoulder  09/29/23 Performed by PT: clavicular nodes, short neck, shoulder collectors no abdominals due to tenderness per pt, 5 deep breaths, bil axillary nodes, interaxillary anastamosis, added Lt inguinals and axillo inguinal anstamosis today to prioritize less breast  work and more surrounding area.  then in sidelying posterior interaxillary work and then all steps in reverse.   PROM Lt shoulder  09/17/23 Performed by PT: clavicular nodes, short neck, shoulder collectors no abdominals due to tenderness per pt, 5 deep breaths, bil axillary nodes, interaxillary anastamosis, added Lt inguinals and axillo inguinal anstamosis today to prioritize less breast work and more surrounding area.  then in sidelying posterior interaxillary work and then all steps in reverse.   PROM Lt shoulder  09/09/23 Performed by PT: clavicular nodes, short neck, shoulder collectors no abdominals due to tenderness per pt, 5 deep breaths, bil axillary nodes, interaxillary anastamosis, added Lt inguinals and axillo inguinal anstamosis today to  prioritize less breast work and more surrounding area.  then in sidelying posterior interaxillary work and then all steps in reverse.   PROM Lt shoulder  08/24/23 Performed by PT: clavicular nodes, short neck, shoulder collectors no abdominals due to tenderness per pt, 5 deep breaths, bil axillary nodes, interaxillary anastamosis, added Lt inguinals and axillo inguinal anstamosis today to prioritize less breast work and more surrounding area.  then Lt breast towards nearest pathway.  PROM Lt shoulder  08/11/23 Performed by PT: Steps modified as follows: clavicular nodes, no abdominals due to tenderness per pt, 5 deep breaths, bil axillary nodes, interaxillary anastamosis, then Lt breast towards nearest node bed. Did not use axilloinguinal as lymphatic system is intact.  PROM Lt shoulder  08/05/23 Steps modified as follows: clavicular nodes, 5 deep breaths, bil axillary nodes, interaxillary anastamosis, then Lt breast towards nearest node bed. Did not use axilloinguinal as lymphatic system is intact.  encouraged compression bra use as much as possible as the most important component of decreasing breast edema.  Reviewed with hand over hand on the breast  EVAL: With pt permission for breast MLD Education on lymphatic anatomy and drainage patterns of the breast and principles of MLD Performed each step in supine per instruction section below with PT reading and then performing each step and then with hand over hand cueing and performance by pt with modification as needed - noted below. Then general MLD performed by PT.  Steps modified as follows: clavicular nodes, 5 deep breaths, bil axillary nodes, interaxillary anastamosis, then Lt breast towards nearest node bed. Did not use axilloinguinal as lymphatic system is intact.  encouraged compression bra use as much as possible as the most important component of decreasing breast edema.    PATIENT EDUCATION:  Education details: per today's  note Person educated: Patient Education method: Explanation, Demonstration, Tactile cues, Verbal cues, and Handouts Education comprehension: verbalized understanding, returned demonstration, verbal cues required, tactile cues required, and needs further education  HOME EXERCISE PROGRAM: Self MLD  ASSESSMENT:  CLINICAL IMPRESSION: Continued POC with left breast MLD. Pt demonstrates improvements in her breast complaints questionnaire, missing her goal by 1 point.  We will continue POC 1x per week x 4 weeks to meet all goals.   OBJECTIVE IMPAIRMENTS: decreased knowledge of condition, decreased knowledge of use of DME, increased edema, and increased fascial restrictions.   ACTIVITY LIMITATIONS: none  PARTICIPATION LIMITATIONS: none  PERSONAL FACTORS: Age and 1 comorbidity: radiation hx  are also affecting patient's functional outcome.   REHAB POTENTIAL: Excellent  CLINICAL DECISION MAKING: Stable/uncomplicated  EVALUATION COMPLEXITY: Low  GOALS: Goals reviewed with patient? Yes    LONG TERM GOALS: Target date: 11/19/23  Pt will report pain with touch to the Left breast to 3/10 or less Baseline: 9/10 Goal status: MET  2.  Pt will be ind with self MLD for the left breast Baseline:  Goal status: MET  3.  Pt will decrease breast complaints questionnaire to 10 or less to demonstrate improved status  Baseline: 25, 11 on 10/22/23  Goal status: PARTIALLY MET   PLAN:  PT FREQUENCY: 1x/week - pt preference  PT DURATION: 6 weeks  PLANNED INTERVENTIONS: 97164- PT Re-evaluation, 97110-Therapeutic exercises, 97535- Self Care, 16109- Manual therapy, Patient/Family education, Taping, Manual lymph drainage, and DME instructions  PLAN FOR NEXT SESSION: left breast MLD with education for self MLD:  See modified steps from eval day note - for self MLD - no inguinals   Idamae Lusher, PT 10/22/2023, 10:53 AM

## 2023-11-04 ENCOUNTER — Ambulatory Visit: Payer: Medicare HMO | Attending: Radiation Oncology | Admitting: Rehabilitation

## 2023-11-04 DIAGNOSIS — L599 Disorder of the skin and subcutaneous tissue related to radiation, unspecified: Secondary | ICD-10-CM | POA: Insufficient documentation

## 2023-11-04 DIAGNOSIS — D0512 Intraductal carcinoma in situ of left breast: Secondary | ICD-10-CM | POA: Diagnosis present

## 2023-11-04 DIAGNOSIS — I890 Lymphedema, not elsewhere classified: Secondary | ICD-10-CM | POA: Insufficient documentation

## 2023-11-04 NOTE — Therapy (Signed)
 OUTPATIENT PHYSICAL THERAPY  UPPER EXTREMITY ONCOLOGY TREATMENT  Patient Name: Kaitlyn Ramsey MRN: [id number] DOB:1956-07-25, 68 y.o., female Today's Date: 11/04/2023  END OF SESSION:  PT End of Session - 11/04/23 1605     Visit Number 9    Number of Visits 12    Date for PT Re-Evaluation 11/19/23    PT Start Time 1200    PT Stop Time 1249    PT Time Calculation (min) 49 min    Activity Tolerance Patient tolerated treatment well    Behavior During Therapy WFL for tasks assessed/performed                  Past Medical History:  Diagnosis Date   Anxiety    Dysrhythmia    Gastritis    History of radiation therapy    Left breast-03/31/23-04/29/23- Dr. Antony Blackbird   Hypertension    Past Surgical History:  Procedure Laterality Date   BREAST BIOPSY Left 01/15/2023   [place] LT BREAST BX W LOC DEV 1ST LESION IMG BX SPEC US GUIDE 01/15/2023 [facility]   BREAST BIOPSY  02/16/2023   MM LT RADIOACTIVE SEED LOC MAMMO GUIDE 02/16/2023 [facility]   BREAST LUMPECTOMY WITH RADIOACTIVE SEED LOCALIZATION Left 02/17/2023   Procedure: LEFT BREAST LUMPECTOMY WITH RADIOACTIVE SEED LOCALIZATION;  Surgeon: Manus Rudd, MD;  Location: [facility] OR;  Service: General;  Laterality: Left;  LMA   COLONOSCOPY     KNEE ARTHROSCOPY Left    Patient Active Problem List   Diagnosis Date Noted   Obesity 10/11/2023   Fibromyalgia 10/11/2023   Ductal carcinoma in situ (DCIS) of left breast 01/26/2023   Osteoarthritis 12/30/2017   Hypertension 12/30/2017   Acute depression 12/30/2017    PCP: Dr. Renaye Rakers  REFERRING PROVIDER: Dr. Antony Blackbird  REFERRING DIAG:  Diagnosis  D05.12 (ICD-10-CM) - Ductal carcinoma in situ (DCIS) of left breast    THERAPY DIAG:  Lymphedema, not elsewhere classified  Ductal carcinoma in situ (DCIS) of left breast  Disorder of the skin and subcutaneous tissue related to radiation, unspecified  ONSET DATE: 01/15/23  Rationale for Evaluation and  Treatment: Rehabilitation  SUBJECTIVE:                                                                                                                                                                                           SUBJECTIVE STATEMENT:  It maybe got a little harder since last time.   EVAL: After surgery and radiation everything was fine and now the breast feels painful and heavy.  I have a compression bra but I haven't worn it for awhile.  PERTINENT HISTORY: Lt lumpectomy 02/17/23 due to DCIS - no nodes. Completed radiation. Bad Lt hip and LBP.  I see pain management for them.    PAIN:  Are you having pain? Yes - nothing when sitting but 3/10 when pressing on it NPRS scale: 3/10 Pain location: inside of the left breast  Pain orientation: Left  PAIN TYPE: burning and sharp Pain description: intermittent  Aggravating factors: touching it only Relieving factors: stop touching it  PRECAUTIONS: None  RED FLAGS: None   WEIGHT BEARING RESTRICTIONS: No  FALLS:  Has patient fallen in last 6 months? No  LIVING ENVIRONMENT: Lives with: lives with their family and lives with their daughter Lives in: House/apartment Stairs: No;  Has following equipment at home: none  OCCUPATION: retired   LEISURE: I don't do too much due to a bad Lt hip.    HAND DOMINANCE: right   PRIOR LEVEL OF FUNCTION: Independent  PATIENT GOALS: what do I do for the pain   OBJECTIVE: Note: Objective measures were completed at Evaluation unless otherwise noted.  COGNITION: Overall cognitive status: Within functional limits for tasks assessed   PALPATION: Mild fibrosis medial breast, +1-2 ttp with firmer touch - more than MLD pressure  OBSERVATIONS / OTHER ASSESSMENTS: Lt breast darker with post radiation changes.  Enlaged pores only noted in supinie.   BREAST COMPLAINTS QUESTIONNAIRE    EVAL  09/09/23  10/22/23  Pain:   9  3  3  Heaviness:  6  5  2  Swollen feeling: 5  5  3  Tense  Skin:  0  0  0 Redness:  0  0  0 Bra Print:  0  0  0 Size of Pores:  0  0  0 Hard feeling:   5  5  3  Total:      25  /80  18/80  11/80 A Score over 9 indicates lymphedema issues in the breast     TODAY'S TREATMENT:                                                                                                                                         Pt permission and consent throughout each step of examination and treatment with modification and draping if requested when working on sensitive areas  DATE:  11/04/23 Performed by PT: clavicular nodes, short neck, shoulder collectors no abdominals due to tenderness per pt, 5 deep breaths, bil axillary nodes, interaxillary anastamosis, added Lt inguinals and axillo inguinal anstamosis today to prioritize less breast work and more surrounding area.  then in sidelying posterior interaxillary work and then all steps in reverse.   PROM Lt shoulder  10/22/23 Performed by PT: clavicular nodes, short neck, shoulder collectors no abdominals due to tenderness per pt, 5 deep breaths, bil axillary nodes, interaxillary anastamosis, added Lt inguinals and axillo inguinal anstamosis today to prioritize less breast work and more surrounding area.  then in sidelying posterior interaxillary work and then all steps in reverse.   PROM Lt shoulder  09/29/23 Performed by PT: clavicular nodes, short neck, shoulder collectors no abdominals due to tenderness per pt, 5 deep breaths, bil axillary nodes, interaxillary anastamosis, added Lt inguinals and axillo inguinal anstamosis today to prioritize less breast work and more surrounding area.  then in sidelying posterior interaxillary work and then all steps in reverse.   PROM Lt shoulder   PATIENT EDUCATION:  Education details: per today's note Person educated: Patient Education method: Explanation, Demonstration, Tactile cues, Verbal cues, and Handouts Education comprehension: verbalized understanding, returned  demonstration, verbal cues required, tactile cues required, and needs further education  HOME EXERCISE PROGRAM: Self MLD  ASSESSMENT:  CLINICAL IMPRESSION: Continued POC with left breast MLD. Pt demonstrates a mild increase in medial fibrosis and tenderness today, back to +1 ttp medially.     OBJECTIVE IMPAIRMENTS: decreased knowledge of condition, decreased knowledge of use of DME, increased edema, and increased fascial restrictions.   ACTIVITY LIMITATIONS: none  PARTICIPATION LIMITATIONS: none  PERSONAL FACTORS: Age and 1 comorbidity: radiation hx  are also affecting patient's functional outcome.   REHAB POTENTIAL: Excellent  CLINICAL DECISION MAKING: Stable/uncomplicated  EVALUATION COMPLEXITY: Low  GOALS: Goals reviewed with patient? Yes    LONG TERM GOALS: Target date: 11/19/23  Pt will report pain with touch to the Left breast to 3/10 or less Baseline: 9/10 Goal status: MET  2.  Pt will be ind with self MLD for the left breast Baseline:  Goal status: MET  3.  Pt will decrease breast complaints questionnaire to 10 or less to demonstrate improved status  Baseline: 25, 11 on 10/22/23  Goal status: PARTIALLY MET   PLAN:  PT FREQUENCY: 1x/week - pt preference  PT DURATION: 6 weeks  PLANNED INTERVENTIONS: 97164- PT Re-evaluation, 97110-Therapeutic exercises, 97535- Self Care, [id number]- Manual therapy, Patient/Family education, Taping, Manual lymph drainage, and DME instructions  PLAN FOR NEXT SESSION: left breast MLD with education for self MLD:  See modified steps from eval day note - for self MLD - no inguinals   Idamae Lusher, PT 11/04/2023, 4:06 PM

## 2023-11-11 ENCOUNTER — Encounter: Payer: Medicare HMO | Admitting: Rehabilitation

## 2023-11-18 ENCOUNTER — Ambulatory Visit: Payer: Medicare HMO | Admitting: Rehabilitation

## 2023-11-18 ENCOUNTER — Encounter: Payer: Self-pay | Admitting: Rehabilitation

## 2023-11-18 DIAGNOSIS — I890 Lymphedema, not elsewhere classified: Secondary | ICD-10-CM

## 2023-11-18 DIAGNOSIS — L599 Disorder of the skin and subcutaneous tissue related to radiation, unspecified: Secondary | ICD-10-CM

## 2023-11-18 DIAGNOSIS — D0512 Intraductal carcinoma in situ of left breast: Secondary | ICD-10-CM

## 2023-11-18 NOTE — Therapy (Signed)
 OUTPATIENT PHYSICAL THERAPY  UPPER EXTREMITY ONCOLOGY TREATMENT  Patient Name: Kaitlyn Ramsey MRN: [id number] DOB:03/05/1956, 68 y.o., female Today's Date: 11/18/2023  END OF SESSION:  PT End of Session - 11/18/23 1247     Visit Number 10    PT Start Time 1200    PT Stop Time 1247    PT Time Calculation (min) 47 min    Activity Tolerance Patient tolerated treatment well    Behavior During Therapy WFL for tasks assessed/performed                   Past Medical History:  Diagnosis Date   Anxiety    Dysrhythmia    Gastritis    History of radiation therapy    Left breast-03/31/23-04/29/23- Dr. Antony Blackbird   Hypertension    Past Surgical History:  Procedure Laterality Date   BREAST BIOPSY Left 01/15/2023   [place] LT BREAST BX W LOC DEV 1ST LESION IMG BX SPEC US GUIDE 01/15/2023 [facility]   BREAST BIOPSY  02/16/2023   MM LT RADIOACTIVE SEED LOC MAMMO GUIDE 02/16/2023 [facility]   BREAST LUMPECTOMY WITH RADIOACTIVE SEED LOCALIZATION Left 02/17/2023   Procedure: LEFT BREAST LUMPECTOMY WITH RADIOACTIVE SEED LOCALIZATION;  Surgeon: Manus Rudd, MD;  Location: [facility] OR;  Service: General;  Laterality: Left;  LMA   COLONOSCOPY     KNEE ARTHROSCOPY Left    Patient Active Problem List   Diagnosis Date Noted   Obesity 10/11/2023   Fibromyalgia 10/11/2023   Ductal carcinoma in situ (DCIS) of left breast 01/26/2023   Osteoarthritis 12/30/2017   Hypertension 12/30/2017   Acute depression 12/30/2017    PCP: Dr. Renaye Rakers  REFERRING PROVIDER: Dr. Antony Blackbird  REFERRING DIAG:  Diagnosis  D05.12 (ICD-10-CM) - Ductal carcinoma in situ (DCIS) of left breast    THERAPY DIAG:  Lymphedema, not elsewhere classified  Disorder of the skin and subcutaneous tissue related to radiation, unspecified  Ductal carcinoma in situ (DCIS) of left breast  ONSET DATE: 01/15/23  Rationale for Evaluation and Treatment: Rehabilitation  SUBJECTIVE:                                                                                                                                                                                            SUBJECTIVE STATEMENT:  It maybe got a little harder since last time.   EVAL: After surgery and radiation everything was fine and now the breast feels painful and heavy.  I have a compression bra but I haven't worn it for awhile.    PERTINENT HISTORY: Lt lumpectomy 02/17/23 due to DCIS - no nodes. Completed  radiation. Bad Lt hip and LBP.  I see pain management for them.    PAIN:  Are you having pain? Yes - nothing when sitting but 3/10 when pressing on it NPRS scale: 3/10 Pain location: inside of the left breast  Pain orientation: Left  PAIN TYPE: burning and sharp Pain description: intermittent  Aggravating factors: touching it only Relieving factors: stop touching it  PRECAUTIONS: None  RED FLAGS: None   WEIGHT BEARING RESTRICTIONS: No  FALLS:  Has patient fallen in last 6 months? No  LIVING ENVIRONMENT: Lives with: lives with their family and lives with their daughter Lives in: House/apartment Stairs: No;  Has following equipment at home: none  OCCUPATION: retired   LEISURE: I don't do too much due to a bad Lt hip.    HAND DOMINANCE: right   PRIOR LEVEL OF FUNCTION: Independent  PATIENT GOALS: what do I do for the pain   OBJECTIVE: Note: Objective measures were completed at Evaluation unless otherwise noted.  COGNITION: Overall cognitive status: Within functional limits for tasks assessed   PALPATION: Mild fibrosis medial breast, +1-2 ttp with firmer touch - more than MLD pressure  OBSERVATIONS / OTHER ASSESSMENTS: Lt breast darker with post radiation changes.  Enlaged pores only noted in supinie.   BREAST COMPLAINTS QUESTIONNAIRE    EVAL  09/09/23  10/22/23  11/18/23 Pain:   9  3  3  3  Heaviness:  6  5  2  2  Swollen feeling: 5  5  3  3  Tense Skin:  0  0  0  0 Redness:  0  0  0  0 Bra  Print:  0  0  0  0 Size of Pores:  0  0  0  0  Hard feeling:   5  5  3  2  Total:      25  /80  18/80  11/80  10/80 A Score over 9 indicates lymphedema issues in the breast     TODAY'S TREATMENT:                                                                                                                                         Pt permission and consent throughout each step of examination and treatment with modification and draping if requested when working on sensitive areas  DATE:  11/18/23 Performed by PT: clavicular nodes, short neck, shoulder collectors no abdominals due to tenderness per pt, 5 deep breaths, bil axillary nodes, interaxillary anastamosis, added Lt inguinals and axillo inguinal anstamosis today to prioritize less breast work and more surrounding area.  then in sidelying posterior interaxillary work and then all steps in reverse.   PROM Lt shoulder  11/04/23 Performed by PT: clavicular nodes, short neck, shoulder collectors no abdominals due to tenderness per pt, 5 deep breaths, bil axillary nodes, interaxillary anastamosis, added Lt inguinals and axillo inguinal anstamosis today to  prioritize less breast work and more surrounding area.  then in sidelying posterior interaxillary work and then all steps in reverse.   PROM Lt shoulder  10/22/23 Performed by PT: clavicular nodes, short neck, shoulder collectors no abdominals due to tenderness per pt, 5 deep breaths, bil axillary nodes, interaxillary anastamosis, added Lt inguinals and axillo inguinal anstamosis today to prioritize less breast work and more surrounding area.  then in sidelying posterior interaxillary work and then all steps in reverse.   PROM Lt shoulder  09/29/23 Performed by PT: clavicular nodes, short neck, shoulder collectors no abdominals due to tenderness per pt, 5 deep breaths, bil axillary nodes, interaxillary anastamosis, added Lt inguinals and axillo inguinal anstamosis today to prioritize less breast  work and more surrounding area.  then in sidelying posterior interaxillary work and then all steps in reverse.   PROM Lt shoulder   PATIENT EDUCATION:  Education details: per today's note Person educated: Patient Education method: Explanation, Demonstration, Tactile cues, Verbal cues, and Handouts Education comprehension: verbalized understanding, returned demonstration, verbal cues required, tactile cues required, and needs further education  HOME EXERCISE PROGRAM: Self MLD  ASSESSMENT:  CLINICAL IMPRESSION: Pt is ready for DC. Her breast questionnaire has plateaued over the past month and she is ind with self care and has met all goals.      OBJECTIVE IMPAIRMENTS: decreased knowledge of condition, decreased knowledge of use of DME, increased edema, and increased fascial restrictions.   ACTIVITY LIMITATIONS: none  PARTICIPATION LIMITATIONS: none  PERSONAL FACTORS: Age and 1 comorbidity: radiation hx  are also affecting patient's functional outcome.   REHAB POTENTIAL: Excellent  CLINICAL DECISION MAKING: Stable/uncomplicated  EVALUATION COMPLEXITY: Low  GOALS: Goals reviewed with patient? Yes    LONG TERM GOALS: Target date: 11/19/23  Pt will report pain with touch to the Left breast to 3/10 or less Baseline: 9/10 Goal status: MET  2.  Pt will be ind with self MLD for the left breast Baseline:  Goal status: MET  3.  Pt will decrease breast complaints questionnaire to 10 or less to demonstrate improved status  Baseline: 25, 11 on 10/22/23  Goal status: MET   PLAN:  PT FREQUENCY: 1x/week - pt preference  PT DURATION: 6 weeks  PLANNED INTERVENTIONS: 97164- PT Re-evaluation, 97110-Therapeutic exercises, 97535- Self Care, 78295- Manual therapy, Patient/Family education, Taping, Manual lymph drainage, and DME instructions  PLAN FOR NEXT SESSION: left breast MLD with education for self MLD:  See modified steps from eval day note - for self MLD - no inguinals    Darrielle Pflieger, Julieanne Manson, PT 11/18/2023, 12:48 PM  PHYSICAL THERAPY DISCHARGE SUMMARY  Visits from Start of Care: 10  Current functional level related to goals / functional outcomes: All goals met   Remaining deficits: Chronic breast lymphedema   Education / Equipment: Self care  Plan: Patient agrees to discharge.  Patient is being discharged due to meeting the stated rehab goals.

## 2023-12-23 ENCOUNTER — Ambulatory Visit: Admission: RE | Admit: 2023-12-23 | Payer: Self-pay | Source: Ambulatory Visit | Admitting: Radiation Oncology

## 2023-12-28 ENCOUNTER — Other Ambulatory Visit: Payer: Self-pay | Admitting: Hematology and Oncology

## 2023-12-28 ENCOUNTER — Other Ambulatory Visit: Payer: Medicare HMO

## 2023-12-28 DIAGNOSIS — Z13820 Encounter for screening for osteoporosis: Secondary | ICD-10-CM

## 2023-12-28 DIAGNOSIS — D0512 Intraductal carcinoma in situ of left breast: Secondary | ICD-10-CM

## 2023-12-30 ENCOUNTER — Ambulatory Visit
Admission: RE | Admit: 2023-12-30 | Discharge: 2023-12-30 | Disposition: A | Discharge status: Home / Self Care | Payer: Self-pay | Source: Ambulatory Visit | Attending: Radiation Oncology | Admitting: Radiation Oncology

## 2023-12-30 ENCOUNTER — Telehealth: Payer: Self-pay | Admitting: Radiation Oncology

## 2023-12-30 NOTE — Telephone Encounter (Signed)
 Returned pt's call to r/s today's appt. Spoke to pt and r/s to 5/5 @ 11:15am.

## 2024-01-02 NOTE — Progress Notes (Incomplete)
 Radiation Oncology         [phone] ________________________________  Name: Kaitlyn Ramsey MRN: [id number]  Date: 01/03/2024  DOB: 06-Jan-1956  Follow-Up Visit Note  CC: Jonathon Neighbors, MD  Jonathon Neighbors, MD  No diagnosis found. ***  Diagnosis:   Stage 0 (cTis (DCIS), cN0, cM0) Left Breast, Intermediate grade DCIS involving an intraductal papilloma, ER+ / PR+ / Her2 not assessed: s/p lumpectomy       Interval Since Last Radiation: 8  months and 6 days     Indication for treatment: Curative       Radiation treatment dates: 03/31/23 through 04/29/23  Site/dose:   1) Left breast 40.05 Gy delivered in 15 Fx at 2.67 Gy/Fx 2) Left breast boost - 12 Gy delivered in 6 Fx at 2.00 Gy/Fx Beams/energy: 10X-FFF   Narrative:  The patient returns today for routine follow-up. She was last seen in office on 09/23/23 for a routine follow up.   Since her last visit, she presented for a follow-up visit with Alwin Baars NP on 10/11/23. During which time, she was noted to be tolerated anastrozole  well. She did endorse fatigue and abdominal discomfort at that time but she is unsure if this is related to her antiestrogen therapy or her other medications.   She was also recently discharged from PT on 11/18/23 and has met her rehab goals.   No other significant oncologic interval history since the patient was last seen.   ***                              Allergies:  is allergic to penicillins.  Meds: Current Outpatient Medications  Medication Sig Dispense Refill   amLODipine (NORVASC) 10 MG tablet Take 10 mg by mouth at bedtime.     anastrozole  (ARIMIDEX ) 1 MG tablet Take 1 tablet (1 mg total) by mouth daily. 90 tablet 3   atorvastatin (LIPITOR) 20 MG tablet Take 20 mg by mouth daily.     Biotin 28413 MCG TABS Take 10,000 mcg by mouth daily.     Calcium Citrate-Vitamin D (CALCIUM + D PO) Take 1 tablet by mouth daily.     Cholecalciferol (VITAMIN D3 MAXIMUM STRENGTH) 125 MCG (5000 UT)  capsule Take 5,000 Units by mouth daily.     doxycycline  (VIBRA -TABS) 100 MG tablet Take 1 tablet (100 mg total) by mouth 2 (two) times daily. (Patient not taking: Reported on 09/23/2023) 14 tablet 0   gabapentin (NEURONTIN) 600 MG tablet Take 600 mg by mouth 3 (three) times daily.     metoprolol tartrate (LOPRESSOR) 25 MG tablet Take 25 mg by mouth 2 (two) times daily.     Milk Thistle 1000 MG CAPS Take 1,000 mg by mouth daily.     Multiple Vitamin (MULTIVITAMIN WITH MINERALS) TABS tablet Take 1 tablet by mouth daily.     oxyCODONE -acetaminophen  (PERCOCET) 10-325 MG tablet Take 1 tablet by mouth every 6 (six) hours as needed for pain.     Semaglutide, 2 MG/DOSE, (OZEMPIC, 2 MG/DOSE,) 8 MG/3ML SOPN Take 2 mg by mouth every Tuesday.     valsartan-hydrochlorothiazide (DIOVAN-HCT) 160-25 MG tablet Take 1 tablet by mouth daily.     No current facility-administered medications for this encounter.    Physical Findings: The patient is in no acute distress. Patient is alert and oriented.  vitals were not taken for this visit. .  No significant changes. Lungs are clear to auscultation bilaterally. Heart  has regular rate and rhythm. No palpable cervical, supraclavicular, or axillary adenopathy. Abdomen soft, non-tender, normal bowel sounds.  Right breast: No palpable masses, skin changes, or other abnormalities appreciated. Left breast:   Lab Findings: Lab Results  Component Value Date   WBC 6.3 01/27/2023   HGB 14.5 01/27/2023   HCT 39.5 01/27/2023   MCV 81.4 01/27/2023   PLT 278 01/27/2023    Radiographic Findings: No results found.  Impression: Stage 0 (cTis (DCIS), cN0, cM0) Left Breast, Intermediate grade DCIS involving an intraductal papilloma, ER+ / PR+ / Her2 not assessed: s/p lumpectomy and radiation, currently on Anastrozole  daily  The patient has healed well from the effects of her radiation treatment. No evidence of disease recurrence on clinical exam today. ***  Plan: Patient  is scheduled to see medical oncology for routine follow-up on 01/10/2024. ***   *** minutes of total time was spent for this patient encounter, including preparation, face-to-face counseling with the patient and coordination of care, physical exam, and documentation of the encounter. ____________________________________  Noralee Beam, PhD, MD   This document serves as a record of services personally performed by Retta Caster, MD. It was created on his behalf by Aleta Anda, a trained medical scribe. The creation of this record is based on the scribe's personal observations and the provider's statements to them. This document has been checked and approved by the attending provider.

## 2024-01-03 ENCOUNTER — Ambulatory Visit
Admission: RE | Admit: 2024-01-03 | Discharge: 2024-01-03 | Disposition: A | Discharge status: Home / Self Care | Source: Ambulatory Visit | Attending: Radiation Oncology | Admitting: Radiation Oncology

## 2024-01-05 NOTE — Progress Notes (Incomplete)
 Radiation Oncology         [phone] ________________________________  Name: Kaitlyn Ramsey MRN: [id number]  Date: 01/06/2024  DOB: Jan 18, 1956  Follow-Up Visit Note  CC: Jonathon Neighbors, MD  Jonathon Neighbors, MD  No diagnosis found.   Diagnosis:   Stage 0 (cTis (DCIS), cN0, cM0) Left Breast, Intermediate grade DCIS involving an intraductal papilloma, ER+ / PR+ / Her2 not assessed: s/p lumpectomy       Interval Since Last Radiation: 8  months and 9 days     Indication for treatment: Curative       Radiation treatment dates: 03/31/23 through 04/29/23  Site/dose:   1) Left breast 40.05 Gy delivered in 15 Fx at 2.67 Gy/Fx 2) Left breast boost - 12 Gy delivered in 6 Fx at 2.00 Gy/Fx Beams/energy: 10X-FFF   Narrative:  The patient returns today for routine follow-up. She was last seen in office on 09/23/23 for a routine follow up.   Since her last visit, she presented for a follow-up visit with Alwin Baars NP on 10/11/23. During which time, she was noted to be tolerated anastrozole  well. She did endorse fatigue and abdominal discomfort at that time but she is unsure if this is related to her antiestrogen therapy or her other medications.   She was also recently discharged from PT on 11/18/23 and has met her rehab goals.   No other significant oncologic interval history since the patient was last seen.   ***                              Allergies:  is allergic to penicillins.  Meds: Current Outpatient Medications  Medication Sig Dispense Refill   amLODipine (NORVASC) 10 MG tablet Take 10 mg by mouth at bedtime.     anastrozole  (ARIMIDEX ) 1 MG tablet Take 1 tablet (1 mg total) by mouth daily. 90 tablet 3   atorvastatin (LIPITOR) 20 MG tablet Take 20 mg by mouth daily.     Biotin 38756 MCG TABS Take 10,000 mcg by mouth daily.     Calcium Citrate-Vitamin D (CALCIUM + D PO) Take 1 tablet by mouth daily.     Cholecalciferol (VITAMIN D3 MAXIMUM STRENGTH) 125 MCG (5000 UT) capsule  Take 5,000 Units by mouth daily.     doxycycline  (VIBRA -TABS) 100 MG tablet Take 1 tablet (100 mg total) by mouth 2 (two) times daily. (Patient not taking: Reported on 09/23/2023) 14 tablet 0   gabapentin (NEURONTIN) 600 MG tablet Take 600 mg by mouth 3 (three) times daily.     metoprolol tartrate (LOPRESSOR) 25 MG tablet Take 25 mg by mouth 2 (two) times daily.     Milk Thistle 1000 MG CAPS Take 1,000 mg by mouth daily.     Multiple Vitamin (MULTIVITAMIN WITH MINERALS) TABS tablet Take 1 tablet by mouth daily.     oxyCODONE -acetaminophen  (PERCOCET) 10-325 MG tablet Take 1 tablet by mouth every 6 (six) hours as needed for pain.     Semaglutide, 2 MG/DOSE, (OZEMPIC, 2 MG/DOSE,) 8 MG/3ML SOPN Take 2 mg by mouth every Tuesday.     valsartan-hydrochlorothiazide (DIOVAN-HCT) 160-25 MG tablet Take 1 tablet by mouth daily.     No current facility-administered medications for this encounter.    Physical Findings: The patient is in no acute distress. Patient is alert and oriented.  vitals were not taken for this visit. .  No significant changes. Lungs are clear to auscultation bilaterally. Heart  has regular rate and rhythm. No palpable cervical, supraclavicular, or axillary adenopathy. Abdomen soft, non-tender, normal bowel sounds.  Right breast: No palpable masses, skin changes, or other abnormalities appreciated. Left breast:   Lab Findings: Lab Results  Component Value Date   WBC 6.3 01/27/2023   HGB 14.5 01/27/2023   HCT 39.5 01/27/2023   MCV 81.4 01/27/2023   PLT 278 01/27/2023    Radiographic Findings: No results found.  Impression: Stage 0 (cTis (DCIS), cN0, cM0) Left Breast, Intermediate grade DCIS involving an intraductal papilloma, ER+ / PR+ / Her2 not assessed: s/p lumpectomy and radiation, currently on Anastrozole  daily  The patient has healed well from the effects of her radiation treatment. No evidence of disease recurrence on clinical exam today. ***  Plan: Patient is  scheduled to see medical oncology for routine follow-up on 01/10/2024. ***   *** minutes of total time was spent for this patient encounter, including preparation, face-to-face counseling with the patient and coordination of care, physical exam, and documentation of the encounter. ____________________________________  Noralee Beam, PhD, MD   This document serves as a record of services personally performed by Retta Caster, MD. It was created on his behalf by Lucky Sable, a trained medical scribe. The creation of this record is based on the scribe's personal observations and the provider's statements to them. This document has been checked and approved by the attending provider.

## 2024-01-06 ENCOUNTER — Encounter: Payer: Self-pay | Admitting: Radiation Oncology

## 2024-01-06 ENCOUNTER — Ambulatory Visit
Admission: RE | Admit: 2024-01-06 | Discharge: 2024-01-06 | Disposition: A | Discharge status: Home / Self Care | Source: Ambulatory Visit | Attending: Radiation Oncology | Admitting: Radiation Oncology

## 2024-01-06 VITALS — BP 149/72 | HR 67 | Temp 97.1°F | Resp 18 | Ht 66.0 in | Wt 215.4 lb

## 2024-01-06 DIAGNOSIS — D0512 Intraductal carcinoma in situ of left breast: Secondary | ICD-10-CM | POA: Insufficient documentation

## 2024-01-06 DIAGNOSIS — Z79811 Long term (current) use of aromatase inhibitors: Secondary | ICD-10-CM | POA: Diagnosis not present

## 2024-01-06 DIAGNOSIS — Z923 Personal history of irradiation: Secondary | ICD-10-CM | POA: Insufficient documentation

## 2024-01-06 DIAGNOSIS — Z79899 Other long term (current) drug therapy: Secondary | ICD-10-CM | POA: Insufficient documentation

## 2024-01-06 DIAGNOSIS — Z170 Estrogen receptor positive status [ER+]: Secondary | ICD-10-CM | POA: Insufficient documentation

## 2024-01-06 NOTE — Progress Notes (Signed)
 Kaitlyn Ramsey is here today for follow up post radiation to the breast.   Breast Side:Left    Radiation treatment dates: 03/31/23 through 04/29/23   Does the patient complain of any of the following: Post radiation skin issues: Denies Breast Tenderness: Yes Breast Swelling: Kaitlyn Ramsey, she reports mild swelling. Lymphadema: Denies Range of Motion limitations: Denies Fatigue post radiation: Yes Appetite good/fair/poor: Yes    BP (!) 149/72 (BP Location: Left Arm, Patient Position: Sitting)   Pulse 67   Temp (!) 97.1 F (36.2 C) (Temporal)   Resp 18   Ht 5\' 6"  (1.676 m)   Wt 215 lb 6 oz (97.7 kg)   SpO2 100%   BMI 34.76 kg/m

## 2024-01-07 ENCOUNTER — Encounter (HOSPITAL_COMMUNITY): Payer: Self-pay

## 2024-01-10 ENCOUNTER — Inpatient Hospital Stay: Payer: Medicare HMO | Admitting: Adult Health

## 2024-01-10 ENCOUNTER — Telehealth: Payer: Self-pay | Admitting: Hematology and Oncology

## 2024-01-10 NOTE — Telephone Encounter (Signed)
 Called patient to reschedule appointment

## 2024-01-20 ENCOUNTER — Telehealth: Payer: Self-pay | Admitting: *Deleted

## 2024-01-20 NOTE — Telephone Encounter (Signed)
 CALLED PATIENT TO INFORM HER THAT DR, Eloise Hake WILL BE OFF ON 01-31-24 AND THAT SHE WILL BE SEEING DR. KINARD'S PA - ELLIE MUIR, SPOKE WITH PATIENT AND SHE IS GOOD WITH THIS APPT.

## 2024-01-29 NOTE — Progress Notes (Signed)
 Radiation Oncology         [phone] ________________________________  Name: Kaitlyn Ramsey MRN: [id number]  Date: 01/31/2024  DOB: [birth year]  Follow-Up Visit Note  CC: Jonathon Neighbors, MD  Jonathon Neighbors, MD  No diagnosis found.  Diagnosis: Stage 0 (cTis (DCIS), cN0, cM0) Left Breast, Intermediate grade DCIS involving an intraductal papilloma, ER+ / PR+ / Her2 not assessed: s/p lumpectomy      Interval Since Last Radiation: 9 months and 4 days   Indication for treatment: Curative       Radiation treatment dates: 03/31/23 through 04/29/23  Site/dose:   1) Left breast 40.05 Gy delivered in 15 Fx at 2.67 Gy/Fx 2) Left breast boost - 12 Gy delivered in 6 Fx at 2.00 Gy/Fx Beams/energy: 10X-FFF   Narrative:  The patient returns today for routine follow-up. She was last seen here for follow-up on 01/06/24.   To review from her last visit; she endorsed ongoing swelling and discomfort to her left breast since stopping her PT exercises and wearing a regular bra. Today, she reports ***.   No other significant interval history since the patient was last seen for follow-up. Of note: she is scheduled for a follow-up bilateral diagnostic mammogram later this week (02/03/24).       ***                        Allergies:  is allergic to penicillins.  Meds: Current Outpatient Medications  Medication Sig Dispense Refill   amLODipine (NORVASC) 10 MG tablet Take 10 mg by mouth at bedtime.     anastrozole  (ARIMIDEX ) 1 MG tablet Take 1 tablet (1 mg total) by mouth daily. 90 tablet 3   atorvastatin (LIPITOR) 20 MG tablet Take 20 mg by mouth daily.     Biotin 11914 MCG TABS Take 10,000 mcg by mouth daily.     Calcium Citrate-Vitamin D (CALCIUM + D PO) Take 1 tablet by mouth daily.     Cholecalciferol (VITAMIN D3 MAXIMUM STRENGTH) 125 MCG (5000 UT) capsule Take 5,000 Units by mouth daily.     gabapentin (NEURONTIN) 600 MG tablet Take 600 mg by mouth 3 (three) times daily.     metoprolol  tartrate (LOPRESSOR) 25 MG tablet Take 25 mg by mouth 2 (two) times daily.     Milk Thistle 1000 MG CAPS Take 1,000 mg by mouth daily.     Multiple Vitamin (MULTIVITAMIN WITH MINERALS) TABS tablet Take 1 tablet by mouth daily.     oxyCODONE -acetaminophen  (PERCOCET) 10-325 MG tablet Take 1 tablet by mouth every 6 (six) hours as needed for pain.     valsartan-hydrochlorothiazide (DIOVAN-HCT) 160-25 MG tablet Take 1 tablet by mouth daily.     No current facility-administered medications for this encounter.    Physical Findings: The patient is in no acute distress. Patient is alert and oriented.  vitals were not taken for this visit. .  No significant changes. Lungs are clear to auscultation bilaterally. Heart has regular rate and rhythm. No palpable cervical, supraclavicular, or axillary adenopathy. Abdomen soft, non-tender, normal bowel sounds.  Right Breast: no palpable mass, nipple discharge or bleeding. Left Breast: ***  Lab Findings: Lab Results  Component Value Date   WBC 6.3 01/27/2023   HGB 14.5 01/27/2023   HCT 39.5 01/27/2023   MCV 81.4 01/27/2023   PLT 278 01/27/2023    Radiographic Findings: No results found.  Impression: Stage 0 (cTis (DCIS), cN0, cM0) Left Breast, Intermediate grade  DCIS involving an intraductal papilloma, ER+ / PR+ / Her2 not assessed: s/p lumpectomy       The patient is recovering from the effects of radiation.  ***  Plan:  ***   *** minutes of total time was spent for this patient encounter, including preparation, face-to-face counseling with the patient and coordination of care, physical exam, and documentation of the encounter. ____________________________________  Noralee Beam, PhD, MD  This document serves as a record of services personally performed by Retta Caster, MD. It was created on his behalf by Aleta Anda, a trained medical scribe. The creation of this record is based on the scribe's personal observations and the provider's  statements to them. This document has been checked and approved by the attending provider.

## 2024-01-31 ENCOUNTER — Encounter: Payer: Self-pay | Admitting: Radiation Oncology

## 2024-01-31 ENCOUNTER — Ambulatory Visit
Admission: RE | Admit: 2024-01-31 | Discharge: 2024-01-31 | Disposition: A | Discharge status: Home / Self Care | Source: Ambulatory Visit | Attending: Radiology | Admitting: Radiology

## 2024-01-31 VITALS — BP 151/70 | HR 70 | Temp 96.8°F | Resp 20 | Ht 64.6 in | Wt 222.0 lb

## 2024-01-31 DIAGNOSIS — Z923 Personal history of irradiation: Secondary | ICD-10-CM | POA: Insufficient documentation

## 2024-01-31 DIAGNOSIS — Z170 Estrogen receptor positive status [ER+]: Secondary | ICD-10-CM | POA: Diagnosis not present

## 2024-01-31 DIAGNOSIS — I890 Lymphedema, not elsewhere classified: Secondary | ICD-10-CM | POA: Insufficient documentation

## 2024-01-31 DIAGNOSIS — Z79811 Long term (current) use of aromatase inhibitors: Secondary | ICD-10-CM | POA: Diagnosis not present

## 2024-01-31 DIAGNOSIS — D0512 Intraductal carcinoma in situ of left breast: Secondary | ICD-10-CM | POA: Diagnosis present

## 2024-01-31 DIAGNOSIS — Z79899 Other long term (current) drug therapy: Secondary | ICD-10-CM | POA: Insufficient documentation

## 2024-01-31 NOTE — Progress Notes (Signed)
 Kaitlyn Ramsey is here today to see Julio Ohm PA-C for follow up post radiation to the breast.   Breast Side:Left Side   They completed their radiation on: 04/29/23  Does the patient complain of any of the following: Post radiation skin issues: Yes Breast Tenderness: Yes Breast Swelling: Yes Lymphadema: Denies Range of Motion limitations: Denies Fatigue post radiation: Yes Appetite good/fair/poor: Good    BP (!) 151/70 (BP Location: Left Arm, Patient Position: Sitting, Cuff Size: Large)   Pulse 70   Temp (!) 96.8 F (36 C)   Resp 20   Ht 5' 4.6" (1.641 m)   Wt 222 lb (100.7 kg)   SpO2 99%   BMI 37.40 kg/m

## 2024-01-31 NOTE — Addendum Note (Signed)
 Encounter addended by: Jena Minor, LPN on: 01/02/980 12:35 PM  Actions taken: Charge Capture section accepted

## 2024-02-03 ENCOUNTER — Ambulatory Visit
Admission: RE | Admit: 2024-02-03 | Discharge: 2024-02-03 | Disposition: A | Discharge status: Home / Self Care | Source: Ambulatory Visit | Attending: Adult Health | Admitting: Adult Health

## 2024-02-03 ENCOUNTER — Encounter: Payer: Self-pay | Admitting: *Deleted

## 2024-02-03 ENCOUNTER — Other Ambulatory Visit: Payer: Self-pay | Admitting: Adult Health

## 2024-02-03 DIAGNOSIS — D0512 Intraductal carcinoma in situ of left breast: Secondary | ICD-10-CM

## 2024-02-04 ENCOUNTER — Inpatient Hospital Stay: Attending: Adult Health | Admitting: Adult Health

## 2024-02-04 ENCOUNTER — Encounter: Payer: Self-pay | Admitting: Adult Health

## 2024-02-04 VITALS — BP 130/70 | HR 70 | Temp 98.6°F | Resp 16 | Ht 64.5 in | Wt 222.4 lb

## 2024-02-04 DIAGNOSIS — D0512 Intraductal carcinoma in situ of left breast: Secondary | ICD-10-CM | POA: Insufficient documentation

## 2024-02-04 DIAGNOSIS — R002 Palpitations: Secondary | ICD-10-CM | POA: Insufficient documentation

## 2024-02-04 DIAGNOSIS — Z170 Estrogen receptor positive status [ER+]: Secondary | ICD-10-CM | POA: Insufficient documentation

## 2024-02-04 DIAGNOSIS — Z79811 Long term (current) use of aromatase inhibitors: Secondary | ICD-10-CM | POA: Diagnosis not present

## 2024-02-04 NOTE — Progress Notes (Signed)
 SURVIVORSHIP VISIT:  BRIEF ONCOLOGIC HISTORY:  Oncology History  Ductal carcinoma in situ (DCIS) of left breast  12/30/2022 Mammogram   Bilateral screening mammogram showed a possible asymmetry in the left breast.  Diagnostic mammogram showed suspicious 1 cm mass in the left breast at 2:00 2 cm from the nipple, lymph node with borderline cortical thickening in the left axilla   01/15/2023 Pathology Results   Left breast needle core biopsy at 2:00 showed DCIS, intermediate nuclear grade, cribriform type with necrosis, negative for invasive carcinoma.  Lymph node was benign and reactive.  Prognostic showed ER positive at 100% strong staining PR 95% positive strong staining   02/17/2023 Surgery   Left lumpectomy: DCIS, 1.9 cm, grade 2, no necrosis, margins negative   03/31/2023 - 04/29/2023 Radiation Therapy   Site/dose:   1) Left breast 40.05 Gy delivered in 15 Fx at 2.67 Gy/Fx 2) Left breast boost - 12 Gy delivered in 6 Fx at 2.00 Gy/Fx   07/2023 -  Anti-estrogen oral therapy   Anastrozole      INTERVAL HISTORY:  Kaitlyn Ramsey to review her survivorship care plan detailing her treatment course for breast cancer, as well as monitoring long-term side effects of that treatment, education regarding health maintenance, screening, and overall wellness and health promotion.     Overall, Kaitlyn Ramsey reports feeling moderately well.  She is taking anastrozole  and has some mild manageable night sweats.  She Is experiencing increased anxiety, sleep, and fatigue due to issues care giving for her mom, but otherwise is doing moderately well.  She is unsure if the chest wall discomfort/palpitation sensation that occurs sometimes is related to anxiety or something else.    REVIEW OF SYSTEMS:  Review of Systems  Constitutional:  Positive for fatigue. Negative for appetite change, chills, fever and unexpected weight change.  HENT:   Negative for hearing loss, lump/mass and trouble swallowing.   Eyes:   Negative for eye problems and icterus.  Respiratory:  Negative for chest tightness, cough and shortness of breath.   Cardiovascular:  Negative for chest pain, leg swelling and palpitations.  Gastrointestinal:  Negative for abdominal distention, abdominal pain, constipation, diarrhea, nausea and vomiting.  Endocrine: Positive for hot flashes.  Genitourinary:  Negative for difficulty urinating.   Musculoskeletal:  Negative for arthralgias.  Skin:  Negative for itching and rash.  Neurological:  Negative for dizziness, extremity weakness, headaches and numbness.  Hematological:  Negative for adenopathy. Does not bruise/bleed easily.  Psychiatric/Behavioral:  Negative for depression. The patient is nervous/anxious.    Breast: Denies any new nodularity, masses, tenderness, nipple changes, or nipple discharge.       PAST MEDICAL/SURGICAL HISTORY:  Past Medical History:  Diagnosis Date   Anxiety    Dysrhythmia    Gastritis    History of radiation therapy    Left breast-03/31/23-04/29/23- Dr. Retta Caster   Hypertension    Past Surgical History:  Procedure Laterality Date   BREAST BIOPSY Left 01/15/2023   US  LT BREAST BX W LOC DEV 1ST LESION IMG BX SPEC US  GUIDE 01/15/2023 [facility]   BREAST BIOPSY  02/16/2023   MM LT RADIOACTIVE SEED LOC MAMMO GUIDE 02/16/2023 [facility]   BREAST LUMPECTOMY Left 02/17/2023   BREAST LUMPECTOMY WITH RADIOACTIVE SEED LOCALIZATION Left 02/17/2023   Procedure: LEFT BREAST LUMPECTOMY WITH RADIOACTIVE SEED LOCALIZATION;  Surgeon: Dareen Ebbing, MD;  Location: [facility] OR;  Service: General;  Laterality: Left;  LMA   COLONOSCOPY     KNEE ARTHROSCOPY Left  ALLERGIES:  Allergies  Allergen Reactions   Penicillins Anaphylaxis and Shortness Of Breath     CURRENT MEDICATIONS:  Outpatient Encounter Medications as of 02/04/2024  Medication Sig   amLODipine (NORVASC) 10 MG tablet Take 10 mg by mouth at bedtime.   anastrozole  (ARIMIDEX ) 1 MG  tablet Take 1 tablet (1 mg total) by mouth daily.   atorvastatin (LIPITOR) 20 MG tablet Take 20 mg by mouth daily.   Biotin 45409 MCG TABS Take 10,000 mcg by mouth daily.   Calcium Citrate-Vitamin D (CALCIUM + D PO) Take 1 tablet by mouth daily.   Cholecalciferol (VITAMIN D3 MAXIMUM STRENGTH) 125 MCG (5000 UT) capsule Take 5,000 Units by mouth daily.   gabapentin (NEURONTIN) 600 MG tablet Take 600 mg by mouth 3 (three) times daily.   metoprolol tartrate (LOPRESSOR) 25 MG tablet Take 25 mg by mouth 2 (two) times daily.   Milk Thistle 1000 MG CAPS Take 1,000 mg by mouth daily.   Multiple Vitamin (MULTIVITAMIN WITH MINERALS) TABS tablet Take 1 tablet by mouth daily.   oxyCODONE -acetaminophen  (PERCOCET) 10-325 MG tablet Take 1 tablet by mouth every 6 (six) hours as needed for pain.   valsartan-hydrochlorothiazide (DIOVAN-HCT) 160-25 MG tablet Take 1 tablet by mouth daily.   No facility-administered encounter medications on file as of 02/04/2024.     ONCOLOGIC FAMILY HISTORY:  Family History  Problem Relation Age of Onset   Cancer Paternal Aunt        unknown type   Cancer Cousin        unknown type, paternal first cousin     SOCIAL HISTORY:  Social History   Socioeconomic History   Marital status: Single    Spouse name: Not on file   Number of children: Not on file   Years of education: Not on file   Highest education level: Not on file  Occupational History   Not on file  Tobacco Use   Smoking status: Former    Types: Cigarettes   Smokeless tobacco: Never  Vaping Use   Vaping status: Never Used  Substance and Sexual Activity   Alcohol use: Never   Drug use: Never   Sexual activity: Not on file  Other Topics Concern   Not on file  Social History Narrative   Not on file   Social Drivers of Health   Financial Resource Strain: Not on file  Food Insecurity: Food Insecurity Present (03/17/2023)   Hunger Vital Sign    Worried About Running Out of Food in the Last Year:  Sometimes true    Ran Out of Food in the Last Year: Sometimes true  Transportation Needs: Unknown (03/17/2023)   PRAPARE - Administrator, Civil Service (Medical): Not on file    Lack of Transportation (Non-Medical): No  Physical Activity: Not on file  Stress: Not on file  Social Connections: Unknown (01/13/2022)   Received from [facility]   Social Network    Social Network: Not on file  Intimate Partner Violence: Not At Risk (03/17/2023)   Humiliation, Afraid, Rape, and Kick questionnaire    Fear of Current or Ex-Partner: No    Emotionally Abused: No    Physically Abused: No    Sexually Abused: No     OBSERVATIONS/OBJECTIVE:  BP 130/70   Pulse 70   Temp 98.6 F (37 C) (Oral)   Resp 16   Ht 5' 4.5" (1.638 m)   Wt 222 lb 6.4 oz (100.9 kg)   SpO2  95%   BMI 37.59 kg/m  GENERAL: Patient is a well appearing female in no acute distress HEENT:  Sclerae anicteric.  Oropharynx clear and moist. No ulcerations or evidence of oropharyngeal candidiasis. Neck is supple.  NODES:  No cervical, supraclavicular, or axillary lymphadenopathy palpated.  BREAST EXAM:  left breast s/p lumpectomy and radiation, no sign of local recurrence, right breast benign.   LUNGS:  Clear to auscultation bilaterally.  No wheezes or rhonchi. HEART:  Regular rate and rhythm. No murmur appreciated. ABDOMEN:  Soft, nontender.  Positive, normoactive bowel sounds. No organomegaly palpated. MSK:  No focal spinal tenderness to palpation. Full range of motion bilaterally in the upper extremities. EXTREMITIES:  No peripheral edema.   SKIN:  Clear with no obvious rashes or skin changes. No nail dyscrasia. NEURO:  Nonfocal. Well oriented.  Appropriate affect.   LABORATORY DATA:  None for this visit.  DIAGNOSTIC IMAGING:  None for this visit.      ASSESSMENT AND PLAN:  Ms.. Kaitlyn Ramsey is a pleasant 68 y.o. female with Stage 0 left breast DCIS, ER+/PR+, diagnosed in 12/2022, treated with  lumpectomy, adjuvant radiation therapy, and anti-estrogen therapy with Anastrozole  beginning in 07/2023.  She presents to the Survivorship Clinic for our initial meeting and routine follow-up post-completion of treatment for breast cancer.    1. Stage 0 left breast cancer:  Ms. Tosh is continuing to recover from definitive treatment for breast cancer. She will follow-up with her medical oncologist, Dr.  Arno Bibles in 04/2023 with history and physical exam per surveillance protocol.  She will continue her anti-estrogen therapy with Anastrozole . Thus far, she is tolerating the Anastrozole  well, with minimal side effects. Her mammogram is due 01/2025.   Today, a comprehensive survivorship care plan and treatment summary was reviewed with the patient today detailing her breast cancer diagnosis, treatment course, potential late/long-term effects of treatment, appropriate follow-up care with recommendations for the future, and patient education resources.  A copy of this summary, along with a letter will be sent to the patient's primary care provider via mail/fax/In Basket message after today's visit.    2. Palpitations: Referral to cardiology.  I discussed anxiety is a possible etiology of the discomfort, however it should be a diagnosis of exclusion and considering her left sided breast cancer s/p radiation, and h/o of HTN she should see cardiology for further work up and evaluation.    3. Bone health:  Given Ms. Kreis's age/history of breast cancer and her current treatment regimen including anti-estrogen therapy with Anastrozole , she is at risk for bone demineralization.  She is scheduled for bone density testing in 02/2024.   She was given education on specific activities to promote bone health.  4. Cancer screening:  Due to Ms. Diniz's history and her age, she should receive screening for skin cancers, colon cancer, and gynecologic cancers.  The information and recommendations are listed on the patient's  comprehensive care plan/treatment summary and were reviewed in detail with the patient.    5. Health maintenance and wellness promotion: Ms. Renda was encouraged to consume 5-7 servings of fruits and vegetables per day. We reviewed the "Nutrition Rainbow" handout.  She was also encouraged to engage in moderate to vigorous exercise for 30 minutes per day most days of the week.  She was instructed to limit her alcohol consumption and continue to abstain from tobacco use.     6. Support services/counseling: It is not uncommon for this period of the patient's cancer care trajectory to be one  of many emotions and stressors.   She was given information regarding our available services and encouraged to contact me with any questions or for help enrolling in any of our support group/programs.    Follow up instructions:    -Return to cancer center in 03/2024 for f/u with Dr. Arno Bibles  -Mammogram due in 01/2025 -Cardiology referral -She is welcome to return back to the Survivorship Clinic at any time; no additional follow-up needed at this time.  -Consider referral back to survivorship as a long-term survivor for continued surveillance  The patient was provided an opportunity to ask questions and all were answered. The patient agreed with the plan and demonstrated an understanding of the instructions.   Total encounter time:40 minutes*in face-to-face visit time, chart review, lab review, care coordination, order entry, and documentation of the encounter time.    Alwin Baars, NP 02/04/24 10:32 AM Medical Oncology and Hematology [facility] [address] [place], [place] [address] Tel. [phone]    Fax. [phone]  *Total Encounter Time as defined by the Centers for Medicare and Medicaid Services includes, in addition to the face-to-face time of a patient visit (documented in the note above) non-face-to-face time: obtaining and reviewing outside history, ordering and reviewing  medications, tests or procedures, care coordination (communications with other health care professionals or caregivers) and documentation in the medical record.

## 2024-02-08 ENCOUNTER — Ambulatory Visit: Admitting: Rehabilitation

## 2024-02-16 NOTE — Therapy (Signed)
 OUTPATIENT PHYSICAL THERAPY  UPPER EXTREMITY ONCOLOGY EVALUATION  Patient Name: Kaitlyn Ramsey MRN: [id number] DOB:1956/02/18, 68 y.o., female Today's Date: 02/17/2024  END OF SESSION:  PT End of Session - 02/17/24 1047     Visit Number 1    Number of Visits 9    Date for PT Re-Evaluation 03/30/24    Authorization Type none needed    PT Start Time 1010    PT Stop Time 1047    PT Time Calculation (min) 37 min    Activity Tolerance Patient tolerated treatment well    Behavior During Therapy WFL for tasks assessed/performed           Past Medical History:  Diagnosis Date   Anxiety    Dysrhythmia    Gastritis    History of radiation therapy    Left breast-03/31/23-04/29/23- Dr. Retta Caster   Hypertension    Past Surgical History:  Procedure Laterality Date   BREAST BIOPSY Left 01/15/2023   US  LT BREAST BX W LOC DEV 1ST LESION IMG BX SPEC US  GUIDE 01/15/2023 [facility]   BREAST BIOPSY  02/16/2023   MM LT RADIOACTIVE SEED LOC MAMMO GUIDE 02/16/2023 [facility]   BREAST LUMPECTOMY Left 02/17/2023   BREAST LUMPECTOMY WITH RADIOACTIVE SEED LOCALIZATION Left 02/17/2023   Procedure: LEFT BREAST LUMPECTOMY WITH RADIOACTIVE SEED LOCALIZATION;  Surgeon: Dareen Ebbing, MD;  Location: [facility] OR;  Service: General;  Laterality: Left;  LMA   COLONOSCOPY     KNEE ARTHROSCOPY Left    Patient Active Problem List   Diagnosis Date Noted   Obesity 10/11/2023   Fibromyalgia 10/11/2023   Ductal carcinoma in situ (DCIS) of left breast 01/26/2023   Osteoarthritis 12/30/2017   Hypertension 12/30/2017   Acute depression 12/30/2017    PCP: Dr. Jonathon Neighbors  REFERRING PROVIDER: Dr. Retta Caster  REFERRING DIAG:  Diagnosis  D05.12 (ICD-10-CM) - Ductal carcinoma in situ (DCIS) of left breast    THERAPY DIAG:  Lymphedema, not elsewhere classified  Disorder of the skin and subcutaneous tissue related to radiation, unspecified  Ductal carcinoma in situ (DCIS) of left  breast  ONSET DATE: 01/15/23  Rationale for Evaluation and Treatment: Rehabilitation  SUBJECTIVE:                                                                                                                                                                                           SUBJECTIVE STATEMENT:   Wearing a regular bra.  The other ones don't fit anymore.  Going in July for new ones.  (Encouraged trying to get in sooner) EVAL: it was good for about 1 month and  I had to attend to my mother and got a bit neglected.  The underneath is more swollen and more hard.    PERTINENT HISTORY: Lt lumpectomy 02/17/23 due to DCIS - no nodes. Completed radiation. Bad Lt hip and LBP.  I see pain management for them.    Breast imaging recently: Expected diffuse edema and skin thickening is seen in the LEFT breast.  PAIN:  Are you having pain? Yes - nothing when sitting but 3/10 when pressing on it NPRS scale: 3/10 Pain location: inside of the left breast  Pain orientation: Left  PAIN TYPE: burning and sharp Pain description: intermittent  Aggravating factors: touching it only Relieving factors: stop touching it  PRECAUTIONS: None  RED FLAGS: None   WEIGHT BEARING RESTRICTIONS: No  FALLS:  Has patient fallen in last 6 months? No  LIVING ENVIRONMENT: Lives with: lives with their family and lives with their daughter Lives in: House/apartment Stairs: No;  Has following equipment at home: none  OCCUPATION: retired   LEISURE: I don't do too much due to a bad Lt hip.    HAND DOMINANCE: right   PRIOR LEVEL OF FUNCTION: Independent  PATIENT GOALS: what do I do for the pain   OBJECTIVE: Note: Objective measures were completed at Evaluation unless otherwise noted.  COGNITION: Overall cognitive status: Within functional limits for tasks assessed   PALPATION: Mild fibrosis medial breast, +1-2 ttp with firmer touch - more than MLD pressure  OBSERVATIONS / OTHER ASSESSMENTS: Lt breast  darker with post radiation changes.  Enlaged pores only noted in supinie.   BREAST COMPLAINTS QUESTIONNAIRE                                           EVAL                 09/09/23                 10/22/23               11/18/23  02/17/24 Pain:                                 9                          3                          3                          3   3  Heaviness:                       6                          5                          2                          2   4  Swollen feeling:  5                          5                          3                          3   4  Tense Skin:                      0                          0                          0                          0   2 Redness:                          0                          0                          0                          0   0  Bra Print:                         0                          0                          0                          0   0 Size of Pores:                  0                          0                          0                          0              0 Hard feeling:                   5                          5                          3  2   3 Total:                                25  /80                18/80                  11/80                  10/80  16/80 A Score over 9 indicates lymphedema issues in the breast      TODAY'S TREATMENT:                                                                                                                                          Pt permission and consent throughout each step of examination and treatment with modification and draping if requested when working on sensitive areas  DATE:  02/17/24 Scar tissue STM with cocoa butter around nipple In supine: Short neck, 5 diaphragmatic breaths, Lt axillary nodes, sternal nodes, clavicular nodes, then L breast moving fluid towards pathways spending extra time  in any areas of fibrosis then retracing all steps.   PATIENT EDUCATION:  Education details: per today's note Person educated: Patient Education method: Explanation, Demonstration, Tactile cues, Verbal cues, and Handouts Education comprehension: verbalized understanding, returned demonstration, verbal cues required, tactile cues required, and needs further education  HOME EXERCISE PROGRAM: Self MLD  ASSESSMENT:  CLINICAL IMPRESSION: Patient is a 68 y.o. female who was seen today for physical therapy evaluation and treatment for her chronic Lt breast lymphedema after radiation.  She has an intact lymphatic system but radiation induced damage and fluid stasis due to larger chest size.  She had improvements here with last visits and was not able to keep up with her self care due to caring for her mother.  Her ROM is doing well and she reports no limitations overall.  Pt has not been wearing her compression bra due to being too small so she will get a new one ASAP.  Reports gaining 15-20# since stopping ozempic.    OBJECTIVE IMPAIRMENTS: decreased knowledge of condition, decreased knowledge of use of DME, increased edema, and increased fascial restrictions.   ACTIVITY LIMITATIONS: none  PARTICIPATION LIMITATIONS: none  PERSONAL FACTORS: Age and 1 comorbidity: radiation hx are also affecting patient's functional outcome.   REHAB POTENTIAL: Excellent  CLINICAL DECISION MAKING: Stable/uncomplicated  EVALUATION COMPLEXITY: Low  GOALS: Goals reviewed with patient? Yes    LONG TERM GOALS: Target date: 03/30/24  1.  Pt will be ind with self MLD for the left breast Baseline:  Goal status: INITIAL  3.  Pt will decrease breast complaints questionnaire to 10 or less to demonstrate improved status  Baseline: Goal status: INITIAL  PLAN:  PT FREQUENCY: 2x per week  -  pt preference  PT DURATION: 4 weeks   PLANNED INTERVENTIONS: [id number]- PT Re-evaluation, 97110-Therapeutic exercises,  97535- Self Care, 91478- Manual therapy, Patient/Family education, Taping, Manual lymph drainage, and DME instructions  PLAN FOR NEXT SESSION: left breast MLD and scar work See modified steps from eval day note  Encarnacion Harris, PT 02/17/2024, 10:48 AM

## 2024-02-17 ENCOUNTER — Ambulatory Visit: Attending: Radiology | Admitting: Rehabilitation

## 2024-02-17 ENCOUNTER — Encounter: Payer: Self-pay | Admitting: Rehabilitation

## 2024-02-17 ENCOUNTER — Other Ambulatory Visit: Payer: Self-pay

## 2024-02-17 DIAGNOSIS — L599 Disorder of the skin and subcutaneous tissue related to radiation, unspecified: Secondary | ICD-10-CM | POA: Diagnosis present

## 2024-02-17 DIAGNOSIS — D0512 Intraductal carcinoma in situ of left breast: Secondary | ICD-10-CM | POA: Diagnosis present

## 2024-02-17 DIAGNOSIS — I890 Lymphedema, not elsewhere classified: Secondary | ICD-10-CM | POA: Insufficient documentation

## 2024-02-22 ENCOUNTER — Ambulatory Visit

## 2024-02-24 ENCOUNTER — Encounter: Payer: Self-pay | Admitting: Rehabilitation

## 2024-02-24 ENCOUNTER — Encounter

## 2024-02-24 ENCOUNTER — Ambulatory Visit: Admitting: Rehabilitation

## 2024-02-24 DIAGNOSIS — L599 Disorder of the skin and subcutaneous tissue related to radiation, unspecified: Secondary | ICD-10-CM

## 2024-02-24 DIAGNOSIS — I890 Lymphedema, not elsewhere classified: Secondary | ICD-10-CM | POA: Diagnosis not present

## 2024-02-24 DIAGNOSIS — D0512 Intraductal carcinoma in situ of left breast: Secondary | ICD-10-CM

## 2024-02-24 NOTE — Therapy (Signed)
 OUTPATIENT PHYSICAL THERAPY  UPPER EXTREMITY ONCOLOGY EVALUATION  Patient Name: Kaitlyn Ramsey MRN: [id number] DOB:16-Sep-1955, 68 y.o., female Today's Date: 02/24/2024  END OF SESSION:  PT End of Session - 02/24/24 0900     Visit Number 2    Number of Visits 9    Date for PT Re-Evaluation 03/30/24    PT Start Time 0900    PT Stop Time 0949    PT Time Calculation (min) 49 min    Activity Tolerance Patient tolerated treatment well    Behavior During Therapy [facility] for tasks assessed/performed           Past Medical History:  Diagnosis Date   Anxiety    Dysrhythmia    Gastritis    History of radiation therapy    Left breast-03/31/23-04/29/23- Dr. Lynwood Nasuti   Hypertension    Past Surgical History:  Procedure Laterality Date   BREAST BIOPSY Left 01/15/2023   US  LT BREAST BX W LOC DEV 1ST LESION IMG BX SPEC US  GUIDE 01/15/2023 [facility]   BREAST BIOPSY  02/16/2023   MM LT RADIOACTIVE SEED LOC MAMMO GUIDE 02/16/2023 [facility]   BREAST LUMPECTOMY Left 02/17/2023   BREAST LUMPECTOMY WITH RADIOACTIVE SEED LOCALIZATION Left 02/17/2023   Procedure: LEFT BREAST LUMPECTOMY WITH RADIOACTIVE SEED LOCALIZATION;  Surgeon: Belinda Cough, MD;  Location: [facility] OR;  Service: General;  Laterality: Left;  LMA   COLONOSCOPY     KNEE ARTHROSCOPY Left    Patient Active Problem List   Diagnosis Date Noted   Obesity 10/11/2023   Fibromyalgia 10/11/2023   Ductal carcinoma in situ (DCIS) of left breast 01/26/2023   Osteoarthritis 12/30/2017   Hypertension 12/30/2017   Acute depression 12/30/2017    PCP: Dr. Kennieth Leech  REFERRING PROVIDER: Dr. Lynwood Nasuti  REFERRING DIAG:  Diagnosis  D05.12 (ICD-10-CM) - Ductal carcinoma in situ (DCIS) of left breast    THERAPY DIAG:  Lymphedema, not elsewhere classified  Disorder of the skin and subcutaneous tissue related to radiation, unspecified  Ductal carcinoma in situ (DCIS) of left breast  ONSET DATE:  01/15/23  Rationale for Evaluation and Treatment: Rehabilitation  SUBJECTIVE:                                                                                                                                                                                           SUBJECTIVE STATEMENT:   Its pretty good today.  EVAL: it was good for about 1 month and I had to attend to my mother and got a bit neglected.  The underneath is more swollen and more hard.    PERTINENT HISTORY: Lt  lumpectomy 02/17/23 due to DCIS - no nodes. Completed radiation. Bad Lt hip and LBP.  I see pain management for them.    Breast imaging recently: Expected diffuse edema and skin thickening is seen in the LEFT breast.  PAIN:  Are you having pain? Yes - nothing when sitting but 3/10 when pressing on it NPRS scale: 3/10 Pain location: inside of the left breast  Pain orientation: Left  PAIN TYPE: burning and sharp Pain description: intermittent  Aggravating factors: touching it only Relieving factors: stop touching it  PRECAUTIONS: None  RED FLAGS: None   WEIGHT BEARING RESTRICTIONS: No  FALLS:  Has patient fallen in last 6 months? No  LIVING ENVIRONMENT: Lives with: lives with their family and lives with their daughter Lives in: House/apartment Stairs: No;  Has following equipment at home: none  OCCUPATION: retired   LEISURE: I don't do too much due to a bad Lt hip.    HAND DOMINANCE: right   PRIOR LEVEL OF FUNCTION: Independent  PATIENT GOALS: what do I do for the pain   OBJECTIVE: Note: Objective measures were completed at Evaluation unless otherwise noted.  COGNITION: Overall cognitive status: Within functional limits for tasks assessed   PALPATION: Mild fibrosis medial breast, +1-2 ttp with firmer touch - more than MLD pressure  OBSERVATIONS / OTHER ASSESSMENTS: Lt breast darker with post radiation changes.  Enlaged pores only noted in supinie.   BREAST COMPLAINTS QUESTIONNAIRE                                            EVAL                 09/09/23                 10/22/23               11/18/23  02/17/24 Pain:                                 9                          3                          3                          3   3  Heaviness:                       6                          5                          2                          2   4  Swollen feeling:             5  5                          3                          3   4  Tense Skin:                      0                          0                          0                          0   2 Redness:                          0                          0                          0                          0   0  Bra Print:                         0                          0                          0                          0   0 Size of Pores:                  0                          0                          0                          0              0 Hard feeling:                   5                          5                          3                          2   3  Total:  25  /80                18/80                  11/80                  10/80  16/80 A Score over 9 indicates lymphedema issues in the breast      TODAY'S TREATMENT:                                                                                                                                          Pt permission and consent throughout each step of examination and treatment with modification and draping if requested when working on sensitive areas  DATE:  02/24/24 Scar tissue STM with cocoa butter around nipple In supine: Short neck, 5 diaphragmatic breaths, Lt axillary nodes, sternal nodes, clavicular nodes, then L breast moving fluid towards pathways spending extra time in any areas of fibrosis then retracing all steps. Discussion of HF symptoms as pt is having increased leg swelling and  reports more SOB with activity like laundry.  She has a cardiology appt scheduled already due to palpitations.  She attributes the increased fluid to her recent diet and needing to take her fluid pill again.  She took it yesterday and lost weight and fluid already.  Discussed how to weigh in the morning and letting MD know if SOB gets worse with laying down or in general or she gains weight again.   02/17/24 Scar tissue STM with cocoa butter around nipple In supine: Short neck, 5 diaphragmatic breaths, Lt axillary nodes, sternal nodes, clavicular nodes, then L breast moving fluid towards pathways spending extra time in any areas of fibrosis then retracing all steps.   PATIENT EDUCATION:  Education details: per today's note Person educated: Patient Education method: Explanation, Demonstration, Tactile cues, Verbal cues, and Handouts Education comprehension: verbalized understanding, returned demonstration, verbal cues required, tactile cues required, and needs further education  HOME EXERCISE PROGRAM: Self MLD  ASSESSMENT:  CLINICAL IMPRESSION: +2 ttp to the incision and medial breast corner.  Continued MLD for breast swelling.   OBJECTIVE IMPAIRMENTS: decreased knowledge of condition, decreased knowledge of use of DME, increased edema, and increased fascial restrictions.   ACTIVITY LIMITATIONS: none  PARTICIPATION LIMITATIONS: none  PERSONAL FACTORS: Age and 1 comorbidity: radiation hx are also affecting patient's functional outcome.   REHAB POTENTIAL: Excellent  CLINICAL DECISION MAKING: Stable/uncomplicated  EVALUATION COMPLEXITY: Low  GOALS: Goals reviewed with patient? Yes    LONG TERM GOALS: Target date: 03/30/24  1.  Pt will be ind with self MLD for the left breast Baseline:  Goal status: INITIAL  3.  Pt will decrease breast complaints questionnaire to 10 or less to demonstrate improved status  Baseline: Goal status: INITIAL  PLAN:  PT FREQUENCY: 2x per week  -  pt preference  PT DURATION: 4 weeks   PLANNED INTERVENTIONS: [id number]- PT Re-evaluation, 97110-Therapeutic exercises, 97535- Self Care, [id number]- Manual therapy, Patient/Family education, Taping, Manual lymph drainage, and DME instructions  PLAN FOR NEXT SESSION: left breast MLD and scar work See modified steps from eval day note  Larue Saddie SAUNDERS, PT 02/24/2024, 9:51 AM

## 2024-02-25 ENCOUNTER — Ambulatory Visit: Admitting: Pulmonary Disease

## 2024-02-25 ENCOUNTER — Ambulatory Visit (INDEPENDENT_AMBULATORY_CARE_PROVIDER_SITE_OTHER)

## 2024-02-25 ENCOUNTER — Encounter: Payer: Self-pay | Admitting: Pulmonary Disease

## 2024-02-25 VITALS — BP 162/77 | HR 71 | Ht 66.5 in | Wt 228.0 lb

## 2024-02-25 DIAGNOSIS — R0602 Shortness of breath: Secondary | ICD-10-CM

## 2024-02-25 DIAGNOSIS — R052 Subacute cough: Secondary | ICD-10-CM

## 2024-02-25 MED ORDER — ALBUTEROL SULFATE HFA 108 (90 BASE) MCG/ACT IN AERS
2.0000 | INHALATION_SPRAY | Freq: Four times a day (QID) | RESPIRATORY_TRACT | 6 refills | Status: AC | PRN
Start: 2024-02-25 — End: ?

## 2024-02-25 NOTE — Progress Notes (Signed)
 Synopsis: Referred in June 2025 for Shortness of breath  Subjective:   PATIENT ID: Kaitlyn Ramsey GENDER: female DOB: Jul 26, 1956, MRN: [id number]  HPI  Chief Complaint  Patient presents with   Consult   Kaitlyn Ramsey is a 68 year old woman, former smoker with history of hypertension  and left breast cancer s/p lumpectomy/radiation therapy who is referred to pulmonary clinic for shortness of breath.   Oncology note reviewed from 02/04/24. No onset of respiratory symptoms after radiation treatments.  She has experienced wheezing and shortness of breath for two weeks, with a rough and scratchy voice. There are no preceding cold or infection symptoms. She suspects an allergy to a new dog shampoo. The symptoms are improving and do not disturb her sleep. There is no mucus production, sore throat, or significant nasal or sinus drainage.  She quit smoking in 1995 after smoking two packs a day since age 38. She has undergone radiation therapy without increased respiratory symptoms. 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  Past Medical History:  Diagnosis Date   Anxiety    Dysrhythmia    Gastritis    History of radiation therapy    Left breast-03/31/23-04/29/23- Dr. Lynwood Nasuti   Hypertension      Family History  Problem Relation Age of Onset   Cancer Paternal Aunt        unknown type   Cancer Cousin        unknown type, paternal first cousin     Social History   Socioeconomic History   Marital status: Single    Spouse name: Not on file   Number of children: Not on file   Years of education: Not on file   Highest education level: Not on file  Occupational History   Not on file  Tobacco Use   Smoking status: Former    Types: Cigarettes   Smokeless tobacco: Never  Vaping Use   Vaping status: Never Used  Substance and Sexual Activity   Alcohol use: Never   Drug use: Never   Sexual activity: Not on  file  Other Topics Concern   Not on file  Social History Narrative   Not on file   Social Drivers of Health   Financial Resource Strain: Not on file  Food Insecurity: Food Insecurity Present (03/17/2023)   Hunger Vital Sign    Worried About Running Out of Food in the Last Year: Sometimes true    Ran Out of Food in the Last Year: Sometimes true  Transportation Needs: Unknown (03/17/2023)   PRAPARE - Administrator, Civil Service (Medical): Not on file    Lack of Transportation (Non-Medical): No  Physical Activity: Not on file  Stress: Not on file  Social Connections: Unknown (01/13/2022)   Received from [facility]   Social Network    Social Network: Not on file  Intimate Partner Violence: Not At Risk (03/17/2023)   Humiliation, Afraid, Rape, and Kick questionnaire    Fear of Current or Ex-Partner: No    Emotionally Abused: No    Physically Abused: No    Sexually Abused: No     Allergies  Allergen Reactions   Penicillins Anaphylaxis and Shortness Of Breath     Outpatient Medications Prior to Visit  Medication Sig Dispense Refill   amLODipine (NORVASC) 10 MG tablet Take 10 mg by mouth at bedtime.     anastrozole  (ARIMIDEX ) 1 MG tablet Take  1 tablet (1 mg total) by mouth daily. 90 tablet 3   atorvastatin (LIPITOR) 20 MG tablet Take 20 mg by mouth daily.     Biotin 89999 MCG TABS Take 10,000 mcg by mouth daily.     Calcium Citrate-Vitamin D (CALCIUM + D PO) Take 1 tablet by mouth daily.     Cholecalciferol (VITAMIN D3 MAXIMUM STRENGTH) 125 MCG (5000 UT) capsule Take 5,000 Units by mouth daily.     gabapentin (NEURONTIN) 600 MG tablet Take 600 mg by mouth 3 (three) times daily.     metoprolol tartrate (LOPRESSOR) 25 MG tablet Take 25 mg by mouth 2 (two) times daily.     Milk Thistle 1000 MG CAPS Take 1,000 mg by mouth daily.     Multiple Vitamin (MULTIVITAMIN WITH MINERALS) TABS tablet Take 1 tablet by mouth daily.     oxyCODONE -acetaminophen  (PERCOCET) 10-325 MG  tablet Take 1 tablet by mouth every 6 (six) hours as needed for pain.     valsartan-hydrochlorothiazide (DIOVAN-HCT) 160-25 MG tablet Take 1 tablet by mouth daily.     No facility-administered medications prior to visit.    Review of Systems  Constitutional:  Negative for chills, fever, malaise/fatigue and weight loss.  HENT:  Negative for congestion, sinus pain and sore throat.   Eyes: Negative.   Respiratory:  Positive for shortness of breath and wheezing. Negative for cough, hemoptysis and sputum production.   Cardiovascular:  Negative for chest pain, palpitations, orthopnea, claudication and leg swelling.  Gastrointestinal:  Positive for heartburn. Negative for abdominal pain, nausea and vomiting.  Genitourinary: Negative.   Musculoskeletal:  Negative for joint pain and myalgias.  Skin:  Negative for rash.  Neurological:  Negative for weakness.  Endo/Heme/Allergies: Negative.   Psychiatric/Behavioral: Negative.      Objective:   Vitals:   02/25/24 0905  BP: (!) 162/77  Pulse: 71  SpO2: 95%  Weight: 228 lb (103.4 kg)  Height: 5' 6.5 (1.689 m)   Physical Exam Constitutional:      General: She is not in acute distress.    Appearance: Normal appearance.   Eyes:     General: No scleral icterus.    Conjunctiva/sclera: Conjunctivae normal.    Cardiovascular:     Rate and Rhythm: Normal rate and regular rhythm.  Pulmonary:     Breath sounds: No wheezing, rhonchi or rales.   Musculoskeletal:     Right lower leg: No edema.     Left lower leg: No edema.   Skin:    General: Skin is warm and dry.   Neurological:     General: No focal deficit present.     CBC    Component Value Date/Time   WBC 6.3 01/27/2023 0837   RBC 4.85 01/27/2023 0837   HGB 14.5 01/27/2023 0837   HCT 39.5 01/27/2023 0837   PLT 278 01/27/2023 0837   MCV 81.4 01/27/2023 0837   MCH 29.9 01/27/2023 0837   MCHC 36.7 (H) 01/27/2023 0837   RDW 12.5 01/27/2023 0837   LYMPHSABS 2.1 01/27/2023  0837   MONOABS 0.5 01/27/2023 0837   EOSABS 0.1 01/27/2023 0837   BASOSABS 0.1 01/27/2023 0837     Chest imaging:  PFT:     No data to display          Labs:  Path:  Echo:  Heart Catheterization:       Assessment & Plan:   Subacute cough  Shortness of breath - Plan: DG Chest 2 View, Pulmonary Function Test,  albuterol (VENTOLIN HFA) 108 (90 Base) MCG/ACT inhaler  Discussion: Islam Villescas is a 68 year old woman, former smoker with history of hypertension  and left breast cancer s/p lumpectomy/radiation therapy who is referred to pulmonary clinic for shortness of breath.   Wheezing and shortness of breath Recent wheezing and dyspnea, possibly due to environmental allergies or past radiation. Symptoms improving. Differential includes COPD or other lung conditions. - Order chest x-ray to evaluate lung changes post-radiation. - Prescribe albuterol inhaler, 1-2 puffs every 4-6 hours as needed. - Schedule pulmonary function tests to evaluate for COPD or other lung conditions.  Acid reflux Chronic acid reflux exacerbated by dietary choices, potentially affecting respiratory symptoms. - Advise dietary modifications to reduce fried and spicy food intake.  Follow up in three months to review PFT results and assess symptom progression.  Dorn Chill, MD [facility] Office: [phone]   Current Outpatient Medications:    albuterol (VENTOLIN HFA) 108 (90 Base) MCG/ACT inhaler, Inhale 2 puffs into the lungs every 6 (six) hours as needed for wheezing or shortness of breath., Disp: 8 g, Rfl: 6   amLODipine (NORVASC) 10 MG tablet, Take 10 mg by mouth at bedtime., Disp: , Rfl:    anastrozole  (ARIMIDEX ) 1 MG tablet, Take 1 tablet (1 mg total) by mouth daily., Disp: 90 tablet, Rfl: 3   atorvastatin (LIPITOR) 20 MG tablet, Take 20 mg by mouth daily., Disp: , Rfl:    Biotin 89999 MCG TABS, Take 10,000 mcg by mouth daily., Disp: , Rfl:    Calcium  Citrate-Vitamin D (CALCIUM + D PO), Take 1 tablet by mouth daily., Disp: , Rfl:    Cholecalciferol (VITAMIN D3 MAXIMUM STRENGTH) 125 MCG (5000 UT) capsule, Take 5,000 Units by mouth daily., Disp: , Rfl:    gabapentin (NEURONTIN) 600 MG tablet, Take 600 mg by mouth 3 (three) times daily., Disp: , Rfl:    metoprolol tartrate (LOPRESSOR) 25 MG tablet, Take 25 mg by mouth 2 (two) times daily., Disp: , Rfl:    Milk Thistle 1000 MG CAPS, Take 1,000 mg by mouth daily., Disp: , Rfl:    Multiple Vitamin (MULTIVITAMIN WITH MINERALS) TABS tablet, Take 1 tablet by mouth daily., Disp: , Rfl:    oxyCODONE -acetaminophen  (PERCOCET) 10-325 MG tablet, Take 1 tablet by mouth every 6 (six) hours as needed for pain., Disp: , Rfl:    valsartan-hydrochlorothiazide (DIOVAN-HCT) 160-25 MG tablet, Take 1 tablet by mouth daily., Disp: , Rfl:

## 2024-02-25 NOTE — Patient Instructions (Addendum)
 We will check a chest x-ray today  Use albuterol inhaler 1-2 puffs every 4-6 hours as needed  Continue walking 20-30 minutes 3-5 times per week  Recommend reducing the intake of fried foods that we discussed to help with the lower extremity swelling and reflux  Schedule pulmonary function tests at the front test  Follow up in 3 months to review breathing tests

## 2024-02-29 ENCOUNTER — Encounter: Admitting: Physical Therapy

## 2024-03-01 ENCOUNTER — Ambulatory Visit: Payer: Self-pay | Admitting: Rehabilitation

## 2024-03-02 ENCOUNTER — Ambulatory Visit: Admitting: Rehabilitation

## 2024-03-07 ENCOUNTER — Ambulatory Visit: Attending: Radiology | Admitting: Rehabilitation

## 2024-03-07 ENCOUNTER — Encounter

## 2024-03-07 ENCOUNTER — Encounter: Payer: Self-pay | Admitting: Rehabilitation

## 2024-03-07 DIAGNOSIS — L599 Disorder of the skin and subcutaneous tissue related to radiation, unspecified: Secondary | ICD-10-CM | POA: Diagnosis present

## 2024-03-07 DIAGNOSIS — I890 Lymphedema, not elsewhere classified: Secondary | ICD-10-CM | POA: Diagnosis present

## 2024-03-07 DIAGNOSIS — D0512 Intraductal carcinoma in situ of left breast: Secondary | ICD-10-CM | POA: Diagnosis present

## 2024-03-07 NOTE — Therapy (Signed)
 OUTPATIENT PHYSICAL THERAPY  UPPER EXTREMITY ONCOLOGY  Patient Name: Kaitlyn Ramsey MRN: [id number] DOB:November 05, 1955, 68 y.o., female Today's Date: 03/07/2024  END OF SESSION:  PT End of Session - 03/07/24 1135     Visit Number 3    Number of Visits 9    Date for PT Re-Evaluation 03/30/24    PT Start Time 0904    PT Stop Time 0950    PT Time Calculation (min) 46 min    Activity Tolerance Patient tolerated treatment well    Behavior During Therapy [facility] for tasks assessed/performed           Past Medical History:  Diagnosis Date   Anxiety    Dysrhythmia    Gastritis    History of radiation therapy    Left breast-03/31/23-04/29/23- Dr. Lynwood Nasuti   Hypertension    Past Surgical History:  Procedure Laterality Date   BREAST BIOPSY Left 01/15/2023   US  LT BREAST BX W LOC DEV 1ST LESION IMG BX SPEC US  GUIDE 01/15/2023 [facility]   BREAST BIOPSY  02/16/2023   MM LT RADIOACTIVE SEED LOC MAMMO GUIDE 02/16/2023 [facility]   BREAST LUMPECTOMY Left 02/17/2023   BREAST LUMPECTOMY WITH RADIOACTIVE SEED LOCALIZATION Left 02/17/2023   Procedure: LEFT BREAST LUMPECTOMY WITH RADIOACTIVE SEED LOCALIZATION;  Surgeon: Belinda Cough, MD;  Location: [facility] OR;  Service: General;  Laterality: Left;  LMA   COLONOSCOPY     KNEE ARTHROSCOPY Left    Patient Active Problem List   Diagnosis Date Noted   Obesity 10/11/2023   Fibromyalgia 10/11/2023   Ductal carcinoma in situ (DCIS) of left breast 01/26/2023   Osteoarthritis 12/30/2017   Hypertension 12/30/2017   Acute depression 12/30/2017    PCP: Dr. Kennieth Leech  REFERRING PROVIDER: Dr. Lynwood Nasuti  REFERRING DIAG:  Diagnosis  D05.12 (ICD-10-CM) - Ductal carcinoma in situ (DCIS) of left breast    THERAPY DIAG:  Lymphedema, not elsewhere classified  Disorder of the skin and subcutaneous tissue related to radiation, unspecified  Ductal carcinoma in situ (DCIS) of left breast  ONSET DATE: 01/15/23  Rationale for  Evaluation and Treatment: Rehabilitation  SUBJECTIVE:                                                                                                                                                                                           SUBJECTIVE STATEMENT:   Its pretty good today.  EVAL: it was good for about 1 month and I had to attend to my mother and got a bit neglected.  The underneath is more swollen and more hard.    PERTINENT HISTORY: Lt lumpectomy  02/17/23 due to DCIS - no nodes. Completed radiation. Bad Lt hip and LBP.  I see pain management for them.    Breast imaging recently: Expected diffuse edema and skin thickening is seen in the LEFT breast.  PAIN:  Are you having pain? Yes - nothing when sitting but 3/10 when pressing on it NPRS scale: 3/10 Pain location: inside of the left breast  Pain orientation: Left  PAIN TYPE: burning and sharp Pain description: intermittent  Aggravating factors: touching it only Relieving factors: stop touching it  PRECAUTIONS: None  RED FLAGS: None   WEIGHT BEARING RESTRICTIONS: No  FALLS:  Has patient fallen in last 6 months? No  LIVING ENVIRONMENT: Lives with: lives with their family and lives with their daughter Lives in: House/apartment Stairs: No;  Has following equipment at home: none  OCCUPATION: retired   LEISURE: I don't do too much due to a bad Lt hip.    HAND DOMINANCE: right   PRIOR LEVEL OF FUNCTION: Independent  PATIENT GOALS: what do I do for the pain   OBJECTIVE: Note: Objective measures were completed at Evaluation unless otherwise noted.  COGNITION: Overall cognitive status: Within functional limits for tasks assessed   PALPATION: Mild fibrosis medial breast, +1-2 ttp with firmer touch - more than MLD pressure  OBSERVATIONS / OTHER ASSESSMENTS: Lt breast darker with post radiation changes.  Enlaged pores only noted in supinie.   BREAST COMPLAINTS QUESTIONNAIRE                                            EVAL                 09/09/23                 10/22/23               11/18/23  02/17/24 Pain:                                 9                          3                          3                          3   3  Heaviness:                       6                          5                          2                          2   4  Swollen feeling:             5  5                          3                          3   4  Tense Skin:                      0                          0                          0                          0   2 Redness:                          0                          0                          0                          0   0  Bra Print:                         0                          0                          0                          0   0 Size of Pores:                  0                          0                          0                          0              0 Hard feeling:                   5                          5                          3                          2   3  Total:  25  /80                18/80                  11/80                  10/80  16/80 A Score over 9 indicates lymphedema issues in the breast      TODAY'S TREATMENT:                                                                                                                                          Pt permission and consent throughout each step of examination and treatment with modification and draping if requested when working on sensitive areas  DATE:  03/07/24 Scar tissue STM with cocoa butter around nipple In supine: Short neck, 5 diaphragmatic breaths, Lt axillary nodes, sternal nodes, clavicular nodes, then L breast moving fluid towards pathways spending extra time in any areas of fibrosis then retracing all steps.  02/24/24 Scar tissue STM with cocoa butter around nipple In supine: Short neck, 5  diaphragmatic breaths, Lt axillary nodes, sternal nodes, clavicular nodes, then L breast moving fluid towards pathways spending extra time in any areas of fibrosis then retracing all steps. Discussion of HF symptoms as pt is having increased leg swelling and reports more SOB with activity like laundry.  She has a cardiology appt scheduled already due to palpitations.  She attributes the increased fluid to her recent diet and needing to take her fluid pill again.  She took it yesterday and lost weight and fluid already.  Discussed how to weigh in the morning and letting MD know if SOB gets worse with laying down or in general or she gains weight again.   02/17/24 Scar tissue STM with cocoa butter around nipple In supine: Short neck, 5 diaphragmatic breaths, Lt axillary nodes, sternal nodes, clavicular nodes, then L breast moving fluid towards pathways spending extra time in any areas of fibrosis then retracing all steps.   PATIENT EDUCATION:  Education details: per today's note Person educated: Patient Education method: Explanation, Demonstration, Tactile cues, Verbal cues, and Handouts Education comprehension: verbalized understanding, returned demonstration, verbal cues required, tactile cues required, and needs further education  HOME EXERCISE PROGRAM: Self MLD  ASSESSMENT:  CLINICAL IMPRESSION: +2 ttp to the incision and medial breast corner.  Continued MLD for breast swelling.  Pt noted less SOB after getting an inhaler.    OBJECTIVE IMPAIRMENTS: decreased knowledge of condition, decreased knowledge of use of DME, increased edema, and increased fascial restrictions.   ACTIVITY LIMITATIONS: none  PARTICIPATION LIMITATIONS: none  PERSONAL FACTORS: Age and 1 comorbidity: radiation hx are also affecting patient's functional outcome.   REHAB POTENTIAL: Excellent  CLINICAL DECISION MAKING: Stable/uncomplicated  EVALUATION COMPLEXITY: Low  GOALS: Goals reviewed with patient?  Yes    LONG TERM GOALS: Target  date: 03/30/24  1.  Pt will be ind with self MLD for the left breast Baseline:  Goal status: INITIAL  3.  Pt will decrease breast complaints questionnaire to 10 or less to demonstrate improved status  Baseline: Goal status: INITIAL  PLAN:  PT FREQUENCY: 2x per week  - pt preference  PT DURATION: 4 weeks   PLANNED INTERVENTIONS: 97164- PT Re-evaluation, 97110-Therapeutic exercises, 97535- Self Care, [id number]- Manual therapy, Patient/Family education, Taping, Manual lymph drainage, and DME instructions  PLAN FOR NEXT SESSION: left breast MLD and scar work See modified steps from eval day note  Larue Saddie SAUNDERS, PT 03/07/2024, 11:35 AM

## 2024-03-09 ENCOUNTER — Ambulatory Visit: Admitting: Rehabilitation

## 2024-03-09 ENCOUNTER — Encounter

## 2024-03-14 ENCOUNTER — Ambulatory Visit: Admitting: Rehabilitation

## 2024-03-14 ENCOUNTER — Encounter

## 2024-03-16 ENCOUNTER — Encounter: Admitting: Rehabilitation

## 2024-03-16 ENCOUNTER — Other Ambulatory Visit

## 2024-03-21 ENCOUNTER — Ambulatory Visit: Admitting: Rehabilitation

## 2024-03-28 ENCOUNTER — Ambulatory Visit: Admitting: Rehabilitation

## 2024-03-28 ENCOUNTER — Encounter: Payer: Self-pay | Admitting: Rehabilitation

## 2024-03-28 DIAGNOSIS — D0512 Intraductal carcinoma in situ of left breast: Secondary | ICD-10-CM

## 2024-03-28 DIAGNOSIS — I890 Lymphedema, not elsewhere classified: Secondary | ICD-10-CM

## 2024-03-28 DIAGNOSIS — L599 Disorder of the skin and subcutaneous tissue related to radiation, unspecified: Secondary | ICD-10-CM

## 2024-03-28 NOTE — Therapy (Signed)
 OUTPATIENT PHYSICAL THERAPY  UPPER EXTREMITY ONCOLOGY  Patient Name: Kaitlyn Ramsey MRN: [id number] DOB:[birth year], 68 y.o., female Today's Date: 03/28/2024  END OF SESSION:  PT End of Session - 03/28/24 1230     Visit Number 4    Number of Visits 9    Date for PT Re-Evaluation 03/30/24    PT Start Time 1145    PT Stop Time 1230    PT Time Calculation (min) 45 min    Activity Tolerance Patient tolerated treatment well    Behavior During Therapy [facility] for tasks assessed/performed            Past Medical History:  Diagnosis Date   Anxiety    Dysrhythmia    Gastritis    History of radiation therapy    Left breast-03/31/23-04/29/23- Dr. Lynwood Nasuti   Hypertension    Past Surgical History:  Procedure Laterality Date   BREAST BIOPSY Left 01/15/2023   US  LT BREAST BX W LOC DEV 1ST LESION IMG BX SPEC US  GUIDE 01/15/2023 [facility]   BREAST BIOPSY  02/16/2023   MM LT RADIOACTIVE SEED LOC MAMMO GUIDE 02/16/2023 [facility]   BREAST LUMPECTOMY Left 02/17/2023   BREAST LUMPECTOMY WITH RADIOACTIVE SEED LOCALIZATION Left 02/17/2023   Procedure: LEFT BREAST LUMPECTOMY WITH RADIOACTIVE SEED LOCALIZATION;  Surgeon: Belinda Cough, MD;  Location: [facility] OR;  Service: General;  Laterality: Left;  LMA   COLONOSCOPY     KNEE ARTHROSCOPY Left    Patient Active Problem List   Diagnosis Date Noted   Obesity 10/11/2023   Fibromyalgia 10/11/2023   Ductal carcinoma in situ (DCIS) of left breast 01/26/2023   Osteoarthritis 12/30/2017   Hypertension 12/30/2017   Acute depression 12/30/2017    PCP: Dr. Kennieth Leech  REFERRING PROVIDER: Dr. Lynwood Nasuti  REFERRING DIAG:  Diagnosis  D05.12 (ICD-10-CM) - Ductal carcinoma in situ (DCIS) of left breast    THERAPY DIAG:  Lymphedema, not elsewhere classified  Disorder of the skin and subcutaneous tissue related to radiation, unspecified  Ductal carcinoma in situ (DCIS) of left breast  ONSET DATE: 01/15/23  Rationale  for Evaluation and Treatment: Rehabilitation  SUBJECTIVE:                                                                                                                                                                                           SUBJECTIVE STATEMENT:   My mom fell so that's why I haven't been here.  The breast still hurts but its not as swollen.   EVAL: it was good for about 1 month and I had to attend to my mother and got a  bit neglected.  The underneath is more swollen and more hard.    PERTINENT HISTORY: Lt lumpectomy 02/17/23 due to DCIS - no nodes. Completed radiation. Bad Lt hip and LBP.  I see pain management for them.    Breast imaging recently: Expected diffuse edema and skin thickening is seen in the LEFT breast.  PAIN:  Are you having pain? Yes - nothing when sitting but 3/10 when pressing on it NPRS scale: 3/10 Pain location: inside of the left breast  Pain orientation: Left  PAIN TYPE: burning and sharp Pain description: intermittent  Aggravating factors: touching it only Relieving factors: stop touching it  PRECAUTIONS: None  RED FLAGS: None   WEIGHT BEARING RESTRICTIONS: No  FALLS:  Has patient fallen in last 6 months? No  LIVING ENVIRONMENT: Lives with: lives with their family and lives with their daughter Lives in: House/apartment Stairs: No;  Has following equipment at home: none  OCCUPATION: retired   LEISURE: I don't do too much due to a bad Lt hip.    HAND DOMINANCE: right   PRIOR LEVEL OF FUNCTION: Independent  PATIENT GOALS: what do I do for the pain   OBJECTIVE: Note: Objective measures were completed at Evaluation unless otherwise noted.  COGNITION: Overall cognitive status: Within functional limits for tasks assessed   PALPATION: Mild fibrosis medial breast, +1-2 ttp with firmer touch - more than MLD pressure  OBSERVATIONS / OTHER ASSESSMENTS: Lt breast darker with post radiation changes.  Enlaged pores only noted in  supinie.   BREAST COMPLAINTS QUESTIONNAIRE                                           EVAL                 09/09/23                 10/22/23               11/18/23  02/17/24  03/28/24 Pain:                                 9                          3                          3                          3   3  3  Heaviness:                       6                          5                          2                          2   4   0 Swollen feeling:             5  5                          3                          3   4  3  Tense Skin:                      0                          0                          0                          0   2  2 Redness:                          0                          0                          0                          0   0  0  Bra Print:                         0                          0                          0                          0   0  0 Size of Pores:                  0                          0                          0                          0              0  0  Hard feeling:                   5                          5                          3  2   3  3  Total:                                25  /80                18/80                  11/80                  10/80  16/80  11/80 A Score over 9 indicates lymphedema issues in the breast      TODAY'S TREATMENT:                                                                                                                                          Pt permission and consent throughout each step of examination and treatment with modification and draping if requested when working on sensitive areas  DATE:  03/28/24 Scar tissue STM with cocoa butter around nipple In supine: Short neck, 5 diaphragmatic breaths, Lt axillary nodes, sternal nodes, clavicular nodes, then L breast moving fluid towards pathways spending extra time in any areas of fibrosis  then retracing all steps. Made pt new spaghetti foam and chip bag foam and showed her swell spot again for D/C  03/07/24 Scar tissue STM with cocoa butter around nipple In supine: Short neck, 5 diaphragmatic breaths, Lt axillary nodes, sternal nodes, clavicular nodes, then L breast moving fluid towards pathways spending extra time in any areas of fibrosis then retracing all steps.  02/24/24 Scar tissue STM with cocoa butter around nipple In supine: Short neck, 5 diaphragmatic breaths, Lt axillary nodes, sternal nodes, clavicular nodes, then L breast moving fluid towards pathways spending extra time in any areas of fibrosis then retracing all steps. Discussion of HF symptoms as pt is having increased leg swelling and reports more SOB with activity like laundry.  She has a cardiology appt scheduled already due to palpitations.  She attributes the increased fluid to her recent diet and needing to take her fluid pill again.  She took it yesterday and lost weight and fluid already.  Discussed how to weigh in the morning and letting MD know if SOB gets worse with laying down or in general or she gains weight again.   02/17/24 Scar tissue STM with cocoa butter around nipple In supine: Short neck, 5 diaphragmatic breaths, Lt axillary nodes, sternal nodes, clavicular nodes, then L breast moving fluid towards pathways spending extra time in any areas of fibrosis then retracing all steps.   PATIENT EDUCATION:  Education details: per today's note Person educated: Patient Education method: Explanation, Demonstration, Tactile cues, Verbal cues, and Handouts Education comprehension: verbalized understanding, returned demonstration, verbal cues required, tactile cues required, and needs further education  HOME EXERCISE PROGRAM: Self MLD  ASSESSMENT:  CLINICAL IMPRESSION:   Re-did breast complaints questionnaire which is now back down to almost prior levels at 11/80 compared to goal of 10/80.  Continued MLD  for breast swelling.    OBJECTIVE IMPAIRMENTS: decreased knowledge of condition, decreased knowledge of use of DME, increased edema, and increased fascial restrictions.   ACTIVITY LIMITATIONS: none  PARTICIPATION LIMITATIONS: none  PERSONAL FACTORS: Age and 1 comorbidity: radiation hx are also affecting patient's functional outcome.   REHAB POTENTIAL: Excellent  CLINICAL DECISION MAKING: Stable/uncomplicated  EVALUATION COMPLEXITY: Low  GOALS: Goals reviewed with patient? Yes    LONG TERM GOALS: Target date: 03/30/24  1.  Pt will be ind with self MLD for the left breast Baseline:  Goal status: MET  3.  Pt will decrease breast complaints questionnaire to 10 or less to demonstrate improved status  Baseline: Goal status: MOSTLY MET at 11/80  PLAN:  PT FREQUENCY: 2x per week  - pt preference  PT DURATION: 4 weeks   PLANNED INTERVENTIONS: 97164- PT Re-evaluation, 97110-Therapeutic exercises, 97535- Self Care, 02859- Manual therapy, Patient/Family education, Taping, Manual lymph drainage, and DME instructions  PLAN FOR NEXT SESSION: left breast MLD and scar work See modified steps from eval day note  Larue Saddie SAUNDERS, PT 03/28/2024, 12:31 PM  PHYSICAL THERAPY DISCHARGE SUMMARY  Visits from Start of Care: 4  Current functional level related to goals / functional outcomes: See above   Remaining deficits: Chronic Left breast lymphedema and pain   Education / Equipment: Compression, foam, self MLD  Plan: Patient agrees to discharge.  Patient is being discharged due to meeting the stated rehab goals.    Saddie Larue, PT

## 2024-04-02 NOTE — Progress Notes (Incomplete)
 Radiation Oncology         [phone] ________________________________  Name: SHANIKIA KERNODLE MRN: [id number]  Date: 04/03/2024  DOB: 05-10-1956  Follow-Up Visit Note  CC: Benjamine Aland, MD  Benjamine Aland, MD  No diagnosis found.  Diagnosis:  Stage 0 (cTis (DCIS), cN0, cM0) Left Breast, Intermediate grade DCIS involving an intraductal papilloma, ER+ / PR+ / Her2 not assessed: s/p lumpectomy and adjuvant radiation completed on 04/28/2024   Interval Since Last Radiation: 11 months and 6 days   Indication for treatment: Curative       Radiation treatment dates: 03/31/23 through 04/29/23  Site/dose:   1) Left breast 40.05 Gy delivered in 15 Fx at 2.67 Gy/Fx 2) Left breast boost - 12 Gy delivered in 6 Fx at 2.00 Gy/Fx Beams/energy: 10X-FFF  Narrative:  The patient returns today for routine follow-up. She was last seen here for follow-up on 01/31/24.     Since her last visit, she followed up with medical oncology on 02/04/24 to review her survivorship care plan. During which time, she was noted to be tolerating antiestrogen therapy with anastrozole  well other than mild nigh sweats. She also endorsed some chest wall discomfort/palpitations which she suspects may be due to anxiety. However, given her history of hypertension and cancer, a referral to cardiology was placed to rule out other etiologies for her symptoms.     She was also recently seen in consultation by Dr. Kara at [facility] on 02/25/24 for evaluation of wheezing and SOB x 2 weeks. During the visit, she reported that her symptoms had slightly improved without intervention. She was also noted to attribute her symptoms to environmental allergies (in part). Given her past radiation therapy and smoking history, a chest x-ray was performed that day that showed no acute cardiopulmonary findings. She was accordingly prescribed a albuterol  inhaler for as-needed management of her symptoms.   Pertinent imaging performed in the  interval since her last vist includes a follow-up bilateral diagnostic mammogram and left breast ultrasound on 02/03/24 which demonstrated: post-treatment changes s/p left breast lumpectomy and radiation therapy, and no mammographic or sonographic evidence of recurrent malignancy. Of note: she also endorsed having non-focal left breast pain at the time of this study; possibly attributed to post-treatment changes.     ***                    Allergies:  is allergic to penicillins.  Meds: Current Outpatient Medications  Medication Sig Dispense Refill   albuterol  (VENTOLIN  HFA) 108 (90 Base) MCG/ACT inhaler Inhale 2 puffs into the lungs every 6 (six) hours as needed for wheezing or shortness of breath. 8 g 6   amLODipine (NORVASC) 10 MG tablet Take 10 mg by mouth at bedtime.     anastrozole  (ARIMIDEX ) 1 MG tablet Take 1 tablet (1 mg total) by mouth daily. 90 tablet 3   atorvastatin (LIPITOR) 20 MG tablet Take 20 mg by mouth daily.     Biotin 89999 MCG TABS Take 10,000 mcg by mouth daily.     Calcium Citrate-Vitamin D (CALCIUM + D PO) Take 1 tablet by mouth daily.     Cholecalciferol (VITAMIN D3 MAXIMUM STRENGTH) 125 MCG (5000 UT) capsule Take 5,000 Units by mouth daily.     gabapentin (NEURONTIN) 600 MG tablet Take 600 mg by mouth 3 (three) times daily.     metoprolol tartrate (LOPRESSOR) 25 MG tablet Take 25 mg by mouth 2 (two) times daily.     Milk  Thistle 1000 MG CAPS Take 1,000 mg by mouth daily.     Multiple Vitamin (MULTIVITAMIN WITH MINERALS) TABS tablet Take 1 tablet by mouth daily.     oxyCODONE -acetaminophen  (PERCOCET) 10-325 MG tablet Take 1 tablet by mouth every 6 (six) hours as needed for pain.     valsartan-hydrochlorothiazide (DIOVAN-HCT) 160-25 MG tablet Take 1 tablet by mouth daily.     No current facility-administered medications for this encounter.    Physical Findings: The patient is in no acute distress. Patient is alert and oriented.  vitals were not taken for this  visit. .  No significant changes. Lungs are clear to auscultation bilaterally. Heart has regular rate and rhythm. No palpable cervical, supraclavicular, or axillary adenopathy. Abdomen soft, non-tender, normal bowel sounds.  Right Breast: no palpable mass, nipple discharge or bleeding. Left Breast: ***  Lab Findings: Lab Results  Component Value Date   WBC 6.3 01/27/2023   HGB 14.5 01/27/2023   HCT 39.5 01/27/2023   MCV 81.4 01/27/2023   PLT 278 01/27/2023    Radiographic Findings: No results found.  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  ***  Plan:  ***   *** minutes of total time was spent for this patient encounter, including preparation, face-to-face counseling with the patient and coordination of care, physical exam, and documentation of the encounter. ____________________________________  Lynwood CHARM Nasuti, PhD, MD  This document serves as a record of services personally performed by Lynwood Nasuti, MD. It was created on his behalf by Dorthy Fuse, a trained medical scribe. The creation of this record is based on the scribe's personal observations and the provider's statements to them. This document has been checked and approved by the attending provider.

## 2024-04-03 ENCOUNTER — Telehealth: Payer: Self-pay | Admitting: Radiation Oncology

## 2024-04-03 ENCOUNTER — Ambulatory Visit
Admission: RE | Admit: 2024-04-03 | Discharge: 2024-04-03 | Disposition: A | Discharge status: Home / Self Care | Payer: Self-pay | Source: Ambulatory Visit | Attending: Radiation Oncology | Admitting: Radiation Oncology

## 2024-04-03 NOTE — Telephone Encounter (Signed)
 8/4 Received message from call center, patient would like to reschedule her FU15 appt for today, due to being out of town.  Left voicemail for patient to call office to be r/s.  Waiting on call back.

## 2024-04-04 ENCOUNTER — Encounter: Payer: Self-pay | Admitting: *Deleted

## 2024-04-04 ENCOUNTER — Telehealth: Payer: Self-pay | Admitting: Radiation Oncology

## 2024-04-04 NOTE — Telephone Encounter (Signed)
 8/5 Left voicemail for patient to call our office to be r/s for missed FU appt.

## 2024-04-05 ENCOUNTER — Ambulatory Visit: Admitting: Nurse Practitioner

## 2024-04-05 ENCOUNTER — Encounter: Payer: Self-pay | Admitting: Nurse Practitioner

## 2024-04-05 NOTE — Progress Notes (Deleted)
 Cardiology Clinic Note   Patient Name: Kaitlyn Ramsey Date of Encounter: 04/05/2024  Primary Care Provider:  Benjamine Aland, MD Primary Cardiologist:  None - new  Cardiology APP:  Vivienne Lonni Ingle, NP   Patient Profile    68 y.o. female with a history of hypertension, hyperlipidemia, obesity, left breast cancer status postlumpectomy and adjuvant radiation (completed eight 2024), remote tobacco abuse, lumbar spondylolisthesis, osteoarthritis, anxiety, gastritis, and environmental allergies, who presents to establish care related to palpitations.  Past Medical History    Past Medical History:  Diagnosis Date   Anxiety    Environmental allergies    Gastritis    History of left breast cancer    a. 01/2023 radioactive seed localized lumpectomy and this was followed by adjuvant radiation, which was completed in late August 2024.   History of radiation therapy    Left breast-03/31/23-04/29/23- Dr. Lynwood Nasuti   History of tobacco abuse    Hyperlipidemia    Hypertension    Obesity    Osteoarthritis    Palpitations    Spondylolisthesis of lumbar region    Past Surgical History:  Procedure Laterality Date   BREAST BIOPSY Left 01/15/2023   US  LT BREAST BX W LOC DEV 1ST LESION IMG BX SPEC US  GUIDE 01/15/2023 [facility]   BREAST BIOPSY  02/16/2023   MM LT RADIOACTIVE SEED LOC MAMMO GUIDE 02/16/2023 [facility]   BREAST LUMPECTOMY Left 02/17/2023   BREAST LUMPECTOMY WITH RADIOACTIVE SEED LOCALIZATION Left 02/17/2023   Procedure: LEFT BREAST LUMPECTOMY WITH RADIOACTIVE SEED LOCALIZATION;  Surgeon: Belinda Cough, MD;  Location: [facility] OR;  Service: General;  Laterality: Left;  LMA   COLONOSCOPY     KNEE ARTHROSCOPY Left     Allergies  Allergies  Allergen Reactions   Penicillins Anaphylaxis and Shortness Of Breath    History of Present Illness      68 y.o. y/o female with a history of hypertension, hyperlipidemia, obesity, left breast cancer status  postlumpectomy and adjuvant radiation (completed eight 2024), remote tobacco abuse, lumbar spondylolisthesis, osteoarthritis, anxiety, gastritis, and environmental allergies.  She has no prior cardiac history.  She previously smoked up to 2 packs/day, starting at age 10, but quit after 18 years, in 18.  She previously normal ABIs in 2022.  Prior ECG in June of 2024 showed sinus rhythm at 69 bpm with first-degree block.  In 2024, she underwent screening mammogram which showed asymmetry in the left breast.  Subsequent ultrasound showed a 1 x 1 x 0.4 cm longitudinal mass located in the left breast.  She subsequently underwent left breast radioactive seed localized lumpectomy and this was followed by adjuvant radiation, which was completed in late August 2024.  She was recently evaluated by pulmonology in June 2025 in the setting of dyspnea on exertion, wheezing, and hoarseness.  She thought maybe it was secondary to allergies associated with changing a dog shampoo.  Symptoms were improving without intervention.  Chest x-ray was performed and showed no acute cardiopulmonary changes, and she was also scheduled for pulmonary function test.      Objective   Home Medications    Current Outpatient Medications  Medication Sig Dispense Refill   albuterol  (VENTOLIN  HFA) 108 (90 Base) MCG/ACT inhaler Inhale 2 puffs into the lungs every 6 (six) hours as needed for wheezing or shortness of breath. 8 g 6   amLODipine (NORVASC) 10 MG tablet Take 10 mg by mouth at bedtime.     anastrozole  (ARIMIDEX ) 1 MG tablet  Take 1 tablet (1 mg total) by mouth daily. 90 tablet 3   atorvastatin (LIPITOR) 20 MG tablet Take 20 mg by mouth daily.     Biotin 89999 MCG TABS Take 10,000 mcg by mouth daily.     Calcium Citrate-Vitamin D (CALCIUM + D PO) Take 1 tablet by mouth daily.     Cholecalciferol (VITAMIN D3 MAXIMUM STRENGTH) 125 MCG (5000 UT) capsule Take 5,000 Units by mouth daily.     gabapentin (NEURONTIN) 600 MG tablet  Take 600 mg by mouth 3 (three) times daily.     metoprolol tartrate (LOPRESSOR) 25 MG tablet Take 25 mg by mouth 2 (two) times daily.     Milk Thistle 1000 MG CAPS Take 1,000 mg by mouth daily.     Multiple Vitamin (MULTIVITAMIN WITH MINERALS) TABS tablet Take 1 tablet by mouth daily.     oxyCODONE -acetaminophen  (PERCOCET) 10-325 MG tablet Take 1 tablet by mouth every 6 (six) hours as needed for pain.     valsartan-hydrochlorothiazide (DIOVAN-HCT) 160-25 MG tablet Take 1 tablet by mouth daily.     No current facility-administered medications for this visit.     Family History    Family History  Problem Relation Age of Onset   Cancer Paternal Aunt        unknown type   Cancer Cousin        unknown type, paternal first cousin   She indicated that the status of her paternal aunt is unknown. She indicated that the status of her cousin is unknown.   Social History    Social History   Socioeconomic History   Marital status: Single    Spouse name: Not on file   Number of children: Not on file   Years of education: Not on file   Highest education level: Not on file  Occupational History   Not on file  Tobacco Use   Smoking status: Former    Current packs/day: 0.00    Average packs/day: 2.0 packs/day for 18.0 years (36.0 ttl pk-yrs)    Types: Cigarettes    Start date: 24    Quit date: 27    Years since quitting: 30.6   Smokeless tobacco: Never  Vaping Use   Vaping status: Never Used  Substance and Sexual Activity   Alcohol use: Never   Drug use: Never   Sexual activity: Not on file  Other Topics Concern   Not on file  Social History Narrative   Not on file   Social Drivers of Health   Financial Resource Strain: Not on file  Food Insecurity: Food Insecurity Present (03/17/2023)   Hunger Vital Sign    Worried About Running Out of Food in the Last Year: Sometimes true    Ran Out of Food in the Last Year: Sometimes true  Transportation Needs: Unknown (03/17/2023)    PRAPARE - Administrator, Civil Service (Medical): Not on file    Lack of Transportation (Non-Medical): No  Physical Activity: Not on file  Stress: Not on file  Social Connections: Unknown (01/13/2022)   [facility] from [facility]   Social Network    Social Network: Not on file  Intimate Partner Violence: Not At Risk (03/17/2023)   Humiliation, Afraid, Rape, and Kick questionnaire    Fear of Current or Ex-Partner: No    Emotionally Abused: No    Physically Abused: No    Sexually Abused: No     Review of Systems    General:  No  chills, fever, night sweats or weight changes.  Cardiovascular:  No chest pain, dyspnea on exertion, edema, orthopnea, palpitations, paroxysmal nocturnal dyspnea. Dermatological: No rash, lesions/masses Respiratory: No cough, dyspnea Urologic: No hematuria, dysuria Abdominal:   No nausea, vomiting, diarrhea, bright red blood per rectum, melena, or hematemesis Neurologic:  No visual changes, wkns, changes in mental status. All other systems reviewed and are otherwise negative except as noted above.     Physical Exam    VS:  There were no vitals taken for this visit. , BMI There is no height or weight on file to calculate BMI.        GEN: Well nourished, well developed, in no acute distress. HEENT: normal. Neck: Supple, no JVD, carotid bruits, or masses. Cardiac: RRR, no murmurs, rubs, or gallops. No clubbing, cyanosis, edema.  Radials 2+/PT 2+ and equal bilaterally.  Respiratory:  Respirations regular and unlabored, clear to auscultation bilaterally. GI: Soft, nontender, nondistended, BS + x 4. MS: no deformity or atrophy. Skin: warm and dry, no rash. Neuro:  Strength and sensation are intact. Psych: Normal affect.  Accessory Clinical Findings    ECG personally reviewed by me today-    *** - No acute changes  Lab Results  Component Value Date   WBC 6.3 01/27/2023   HGB 14.5 01/27/2023   HCT 39.5 01/27/2023   MCV 81.4 01/27/2023    PLT 278 01/27/2023   Lab Results  Component Value Date   CREATININE 0.74 01/27/2023   BUN 10 01/27/2023   NA 138 01/27/2023   K 3.8 01/27/2023   CL 100 01/27/2023   CO2 32 01/27/2023   Lab Results  Component Value Date   ALT 18 01/27/2023   AST 22 01/27/2023   ALKPHOS 75 01/27/2023   BILITOT 1.0 01/27/2023   No results found for: CHOL, HDL, LDLCALC, LDLDIRECT, TRIG, CHOLHDL  No results found for: HGBA1C    Assessment & Plan   1.  ***  Lonni Meager, NP 04/05/2024, 7:51 AM

## 2024-04-10 ENCOUNTER — Telehealth: Payer: Self-pay | Admitting: *Deleted

## 2024-04-10 NOTE — Telephone Encounter (Signed)
 LMOVM to verify card hx.

## 2024-04-11 ENCOUNTER — Ambulatory Visit: Admitting: Cardiovascular Disease

## 2024-04-11 ENCOUNTER — Other Ambulatory Visit

## 2024-04-11 ENCOUNTER — Ambulatory Visit: Payer: Medicare HMO | Admitting: Hematology and Oncology

## 2024-04-13 ENCOUNTER — Inpatient Hospital Stay: Admitting: Hematology and Oncology

## 2024-04-17 ENCOUNTER — Ambulatory Visit: Attending: Medical | Admitting: Medical

## 2024-04-17 ENCOUNTER — Encounter: Payer: Self-pay | Admitting: Medical

## 2024-04-17 VITALS — BP 118/70 | HR 81 | Ht 66.5 in | Wt 213.0 lb

## 2024-04-17 DIAGNOSIS — I10 Essential (primary) hypertension: Secondary | ICD-10-CM | POA: Diagnosis not present

## 2024-04-17 DIAGNOSIS — Z79899 Other long term (current) drug therapy: Secondary | ICD-10-CM

## 2024-04-17 DIAGNOSIS — R0609 Other forms of dyspnea: Secondary | ICD-10-CM | POA: Diagnosis not present

## 2024-04-17 DIAGNOSIS — R002 Palpitations: Secondary | ICD-10-CM | POA: Diagnosis not present

## 2024-04-17 DIAGNOSIS — E782 Mixed hyperlipidemia: Secondary | ICD-10-CM

## 2024-04-17 DIAGNOSIS — R079 Chest pain, unspecified: Secondary | ICD-10-CM

## 2024-04-17 MED ORDER — METOPROLOL TARTRATE 100 MG PO TABS: ORAL_TABLET | ORAL | 0 refills | Status: DC

## 2024-04-17 NOTE — Progress Notes (Signed)
 Cardiology Office Note   Date:  04/17/2024  ID:  Kaitlyn Ramsey, DOB [birth year], MRN [id number] PCP: Benjamine Aland, MD  [facility] Providers Cardiologist:  None Cardiology APP:  Vivienne Lonni Ingle, NP   History of Present Illness Kaitlyn Ramsey is a 68 y.o. female with a history of former smoker, anxiety, hypertension, HLD, borderline diabetes, left breast cancer s/p lumpectomy/radiation therapy who presents as a new patient for chest pain and SOB.  She quit smoking in 1995. She follow with pulmonology.   She reports chest tightness that radiates into the jaw. This started when mother moved in last year in March. It occurred 6 times in the last year, but seems to be more frequent. She feels anxious and then feels chest tightness. It lasts for 10 minutes. Breathing deep helps calm the pain. She denies nausea or vomiting. She denies lower leg edema.  She used to do a lot of walking in her job, but is now retired. When she is not anxious she does notice DOE, but no chest pain. Currently not being treated for anxiety.   She reports she was admitted in her 30s for jaw pain in [place] . She said heart enzymes were elevated. Says she had a normal echo and heart cath.   No family history of heart disease. No alcohol or drug use.   Studies Reviewed EKG Interpretation Date/Time:  Monday April 17 2024 13:28:22 EDT Ventricular Rate:  81 PR Interval:  210 QRS Duration:  100 QT Interval:  398 QTC Calculation: 462 R Axis:   66  Text Interpretation: Sinus rhythm with 1st degree A-V block When compared with ECG of 10-Feb-2023 10:43, No significant change was found Confirmed by Franchester, Jaleiyah Alas ([id number]) on 04/17/2024 1:32:43 PM        Physical Exam VS:  BP 118/70 (BP Location: Right Arm, Patient Position: Sitting, Cuff Size: Normal)   Pulse 81   Ht 5' 6.5 (1.689 m)   Wt 213 lb (96.6 kg)   SpO2 98%   BMI 33.86 kg/m        Wt Readings from Last 3 Encounters:   04/17/24 213 lb (96.6 kg)  02/25/24 228 lb (103.4 kg)  02/04/24 222 lb 6.4 oz (100.9 kg)    GEN: Well nourished, well developed in no acute distress NECK: No JVD; No carotid bruits CARDIAC: RRR, no murmurs, rubs, gallops RESPIRATORY:  Clear to auscultation without rales, wheezing or rhonchi  ABDOMEN: Soft, non-tender, non-distended EXTREMITIES:  No edema; No deformity   ASSESSMENT AND PLAN  Chest tightness and DOE The patient reports chest pain w/ jaw pain and SOB related to anxiety. Seems symptoms started a year ago when her mom moved in. Now symptoms seem to be more frequent. She reports a h/o jaw pain and elevated heart enzymes in her 30s while in [place]-echo and heart cath were normal. I will check CMET, CBC, TSH, Mag, and a lipid panel. I will order an echo. I recommended Cardiac CTA, but she did not want to take metoprolol  to lower her heart rate. I also discussed Cardiac Pet stress test, but she would like to wait at this time. Discussed lifestyle changes to improve anxiety.   HTN BP today is well controlled. Continue Valsartan-hydrochlorothiazide 160-25mg  daily, amlodipine 10mg  daily,   Palpitations Patient reports palpitations related to anxiety. Labs as above. Continue metoprolol  25mg  BID. We will evaluate this at follow-up.   HLD I will order lipid panel. Continue Lipitor 20mg  daily.  Dispo: Follow-up in 6-8 weeks  Signed, Zyasia Halbleib VEAR Fishman, PA-C

## 2024-04-17 NOTE — Patient Instructions (Addendum)
 Medication Instructions:  Your physician recommends that you continue on your current medications as directed. Please refer to the Current Medication list given to you today.   *If you need a refill on your cardiac medications before your next appointment, please call your pharmacy*  Lab Work: Your provider would like for you to have following labs drawn today CBC, CMeT, Mag and TSH.   Your provider would like for you to return in 1 week to have the following labs drawn: Lipid panel.   Please go to [facility] [address] [address], [place] [address] You do not need an appointment.  They are open from 8 am- 4:30 pm.  Lunch from 1:00 pm- 2:00 pm You DO need to be fasting.   If you have labs (blood work) drawn today and your tests are completely normal, you will receive your results only by: MyChart Message (if you have MyChart) OR A paper copy in the mail If you have any lab test that is abnormal or we need to change your treatment, we will call you to review the results.  Testing/Procedures: Your physician has requested that you have an echocardiogram. Echocardiography is a painless test that uses sound waves to create images of your heart. It provides your doctor with information about the size and shape of your heart and how well your heart's chambers and valves are working.   You may receive an ultrasound enhancing agent through an IV if needed to better visualize your heart during the echo. This procedure takes approximately one hour.  There are no restrictions for this procedure.  This will take place at [address] [facility]) [address], [place] [address]  Please note: We ask at that you not bring children with you during ultrasound (echo/ vascular) testing. Due to room size and safety concerns, children are not allowed in the ultrasound rooms during exams. Our front office staff cannot provide observation of children in our lobby  area while testing is being conducted. An adult accompanying a patient to their appointment will only be allowed in the ultrasound room at the discretion of the ultrasound technician under special circumstances. We apologize for any inconvenience.  Follow-Up: At [facility], you and your health needs are our priority.  As part of our continuing mission to provide you with exceptional heart care, our providers are all part of one team.  This team includes your primary Cardiologist (physician) and Advanced Practice Providers or APPs (Physician Assistants and Nurse Practitioners) who all work together to provide you with the care you need, when you need it.  Your next appointment:   6-8 week(s)  Provider:   You may see one of the following Advanced Practice Providers on your designated Care Team:   Lonni Meager, NP Lesley Maffucci, PA-C Bernardino Bring, PA-C Cadence [place], PA-C Tylene Lunch, NP Barnie Hila, NP

## 2024-04-18 NOTE — Progress Notes (Signed)
 Radiation Oncology         ([phone]) [phone] ________________________________  Name: Kaitlyn Ramsey MRN: [id number]  Date: 04/20/2024  DOB: [birth year]  Follow-Up Visit Note  CC: Benjamine Aland, MD  Benjamine Aland, MD  No diagnosis found.  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 She was last seen in office  on 01/31/24 for a follow up visit.     In the interval since she was last seen, she followed up with medical oncology on 02/04/24 to review her survivorship care plan. During which time, she was noted to be tolerating antiestrogen therapy with anastrozole  well other than mild nigh sweats. She also endorsed some chest wall discomfort/palpitations which she suspects may be due to anxiety. However, given her history of hypertension and cancer, a referral to cardiology was placed to rule out other etiologies for her symptoms.     She was also recently seen in consultation by Dr. Kara at [facility] on 02/25/24 for evaluation of wheezing and SOB x 2 weeks. During the visit, she reported that her symptoms had slightly improved without intervention. She was also noted to attribute her symptoms to environmental allergies (in part). Given her past radiation therapy and smoking history, a chest x-ray was performed that day that showed no acute cardiopulmonary findings. She was accordingly prescribed a albuterol  inhaler for as-needed management of her symptoms.    Pertinent imaging performed in the interval since her last vist includes a follow-up bilateral diagnostic mammogram and left breast ultrasound on 02/03/24 which demonstrated: post-treatment changes s/p left breast lumpectomy and radiation therapy, and no mammographic or sonographic evidence of recurrent malignancy.   Of note: she also endorsed having non-focal left breast pain at the time of this study; possibly attributed to post-treatment changes.     No other significant oncologic interval history since the patient was last seen.                      Allergies:  is allergic to penicillins.  Meds: Current Outpatient Medications  Medication Sig Dispense Refill   albuterol  (VENTOLIN  HFA) 108 (90 Base) MCG/ACT inhaler Inhale 2 puffs into the lungs every 6 (six) hours as needed for wheezing or shortness of breath. 8 g 6   amLODipine (NORVASC) 10 MG tablet Take 10 mg by mouth at bedtime.     anastrozole  (ARIMIDEX ) 1 MG tablet Take 1 tablet (1 mg total) by mouth daily. 90 tablet 3   atorvastatin (LIPITOR) 20 MG tablet Take 20 mg by mouth daily.     Biotin 89999 MCG TABS Take 10,000 mcg by mouth daily.     Calcium Citrate-Vitamin D (CALCIUM + D PO) Take 1 tablet by mouth daily.     Cholecalciferol (VITAMIN D3 MAXIMUM STRENGTH) 125 MCG (5000 UT) capsule Take 5,000 Units by mouth daily.     gabapentin (NEURONTIN) 600 MG tablet Take 600 mg by mouth 3 (three) times daily.  metoprolol  tartrate (LOPRESSOR ) 25 MG tablet Take 25 mg by mouth 2 (two) times daily.     Milk Thistle 1000 MG CAPS Take 1,000 mg by mouth daily.     Multiple Vitamin (MULTIVITAMIN WITH MINERALS) TABS tablet Take 1 tablet by mouth daily.     oxyCODONE -acetaminophen  (PERCOCET) 10-325 MG tablet Take 1 tablet by mouth every 6 (six) hours as needed for pain.     phentermine 37.5 MG capsule Take 37.5 mg by mouth every morning.     valsartan-hydrochlorothiazide (DIOVAN-HCT) 160-25 MG tablet Take 1 tablet by mouth daily.     No  current facility-administered medications for this encounter.    Physical Findings: The patient is in no acute distress. Patient is alert and oriented.  vitals were not taken for this visit. .  No significant changes. Lungs are clear to auscultation bilaterally. Heart has regular rate and rhythm. No palpable cervical, supraclavicular, or axillary adenopathy. Abdomen soft, non-tender, normal bowel sounds.  Right Breast: no palpable mass, nipple discharge or bleeding. Left Breast: ***  Lab Findings: Lab Results  Component Value Date   WBC 6.3 01/27/2023   HGB 14.5 01/27/2023   HCT 39.5 01/27/2023   MCV 81.4 01/27/2023   PLT 278 01/27/2023    Radiographic Findings: No results found.  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  ***  Plan:  ***   *** minutes of total time was spent for this patient encounter, including preparation, face-to-face counseling with the patient and coordination of care, physical exam, and documentation of the encounter. ____________________________________  Lynwood CHARM Nasuti, PhD, MD  This document serves as a record of services personally performed by Lynwood Nasuti, MD. It was created on his behalf by Reymundo Cartwright, a trained medical scribe. The creation of this record is based on the scribe's personal observations and the provider's statements to them. This document has been checked and approved by the attending provider.

## 2024-04-20 ENCOUNTER — Ambulatory Visit
Admission: RE | Admit: 2024-04-20 | Discharge: 2024-04-20 | Disposition: A | Discharge status: Home / Self Care | Source: Ambulatory Visit | Attending: Radiation Oncology | Admitting: Radiation Oncology

## 2024-04-20 DIAGNOSIS — I890 Lymphedema, not elsewhere classified: Secondary | ICD-10-CM | POA: Diagnosis not present

## 2024-04-20 DIAGNOSIS — Z1721 Progesterone receptor positive status: Secondary | ICD-10-CM | POA: Insufficient documentation

## 2024-04-20 DIAGNOSIS — D0512 Intraductal carcinoma in situ of left breast: Secondary | ICD-10-CM | POA: Diagnosis present

## 2024-04-20 DIAGNOSIS — Z79899 Other long term (current) drug therapy: Secondary | ICD-10-CM | POA: Diagnosis not present

## 2024-04-20 DIAGNOSIS — Z170 Estrogen receptor positive status [ER+]: Secondary | ICD-10-CM | POA: Insufficient documentation

## 2024-04-20 DIAGNOSIS — Z79811 Long term (current) use of aromatase inhibitors: Secondary | ICD-10-CM | POA: Diagnosis not present

## 2024-04-20 DIAGNOSIS — Z923 Personal history of irradiation: Secondary | ICD-10-CM | POA: Insufficient documentation

## 2024-04-20 NOTE — Progress Notes (Signed)
 Kaitlyn Ramsey is here today for follow up post radiation to the breast.   Breast Side: Left Breast    Radiation treatment dates: 03/31/23 through 04/29/23   Does the patient complain of any of the following: Post radiation skin issues: Denies Breast Tenderness: Yes Breast Swelling: Denies Lymphadema: Denies Range of Motion limitations: Denies Fatigue post radiation: Yes, moderate fatigue. Appetite good/fair/poor: Fair  Additional comments if applicable: She is requesting another copy of the second nature prescription.  BP (P) 134/66 (BP Location: Left Arm, Patient Position: Sitting)   Pulse (P) 64   Temp (!) (P) 97.1 F (36.2 C) (Temporal)   Resp (P) 18   Ht (P) 5' 6.5 (1.689 m)   Wt (P) 218 lb 6 oz (99.1 kg)   SpO2 (P) 99%   BMI (P) 34.72 kg/m

## 2024-04-24 ENCOUNTER — Ambulatory Visit: Admitting: Hematology and Oncology

## 2024-04-24 ENCOUNTER — Telehealth: Payer: Self-pay

## 2024-04-24 NOTE — Telephone Encounter (Signed)
 VM/LM -  needs to schedule PFT before up coming appointment

## 2024-05-04 ENCOUNTER — Ambulatory Visit: Admitting: Pulmonary Disease

## 2024-05-04 NOTE — Progress Notes (Deleted)
 Synopsis: Referred in June 2025 for Shortness of breath  Subjective:   PATIENT ID: Kaitlyn Ramsey DOB: Aug 08, 1956, MRN: [id number]  HPI  No chief complaint on file.  Kaitlyn Ramsey is a 68 year old woman, former smoker with history of hypertension  and left breast cancer s/p lumpectomy/radiation therapy who is referred to pulmonary clinic for shortness of breath.   Oncology note reviewed from 02/04/24. No onset of respiratory symptoms after radiation treatments.  She has experienced wheezing and shortness of breath for two weeks, with a rough and scratchy voice. There are no preceding cold or infection symptoms. She suspects an allergy to a new dog shampoo. The symptoms are improving and do not disturb her sleep. There is no mucus production, sore throat, or significant nasal or sinus drainage.  She quit smoking in 1995 after smoking two packs a day since age 65. She has undergone radiation therapy without increased respiratory symptoms. 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  Past Medical History:  Diagnosis Date   Allergy    [facility].   Anxiety    Environmental allergies    Gastritis    History of left breast cancer    a. 01/2023 radioactive seed localized lumpectomy and this was followed by adjuvant radiation, which was completed in late August 2024.   History of radiation therapy    Left breast-03/31/23-04/29/23- Dr. Lynwood Nasuti   History of tobacco abuse    Hyperlipidemia    Hypertension    Obesity    Osteoarthritis    Palpitations    Spondylolisthesis of lumbar region      Family History  Problem Relation Age of Onset   Cancer Paternal Aunt        unknown type   Cancer Cousin        unknown type, paternal first cousin     Social History   Socioeconomic History   Marital status: Single    Spouse name: Not on file   Number of children: Not on file   Years of education: Not on  file   Highest education level: Not on file  Occupational History   Not on file  Tobacco Use   Smoking status: Former    Current packs/day: 0.00    Average packs/day: 2.0 packs/day for 18.0 years (36.0 ttl pk-yrs)    Types: Cigarettes    Start date: 60    Quit date: 36    Years since quitting: 30.6   Smokeless tobacco: Never   Tobacco comments:    I don't remember when I quit I think 1992  Vaping Use   Vaping status: Never Used  Substance and Sexual Activity   Alcohol use: Never   Drug use: Never   Sexual activity: Never  Other Topics Concern   Not on file  Social History Narrative   Not on file   Social Drivers of Health   Financial Resource Strain: Not on file  Food Insecurity: Food Insecurity Present (03/17/2023)   Hunger Vital Sign    Worried About Running Out of Food in the Last Year: Sometimes true    Ran Out of Food in the Last Year: Sometimes true  Transportation Needs: Unknown (03/17/2023)   PRAPARE - Administrator, Civil Service (Medical): Not on file    Lack of Transportation (Non-Medical): No  Physical Activity: Not on file  Stress: Not on file  Social Connections: Unknown (01/13/2022)  Received from [facility]   Social Network    Social Network: Not on file  Intimate Partner Violence: Not At Risk (03/17/2023)   Humiliation, Afraid, Rape, and Kick questionnaire    Fear of Current or Ex-Partner: No    Emotionally Abused: No    Physically Abused: No    Sexually Abused: No     Allergies  Allergen Reactions   Penicillins Anaphylaxis and Shortness Of Breath     Outpatient Medications Prior to Visit  Medication Sig Dispense Refill   albuterol  (VENTOLIN  HFA) 108 (90 Base) MCG/ACT inhaler Inhale 2 puffs into the lungs every 6 (six) hours as needed for wheezing or shortness of breath. 8 g 6   amLODipine (NORVASC) 10 MG tablet Take 10 mg by mouth at bedtime.     anastrozole  (ARIMIDEX ) 1 MG tablet Take 1 tablet (1 mg total) by mouth daily.  90 tablet 3   atorvastatin (LIPITOR) 20 MG tablet Take 20 mg by mouth daily.     Biotin 89999 MCG TABS Take 10,000 mcg by mouth daily.     Calcium Citrate-Vitamin D (CALCIUM + D PO) Take 1 tablet by mouth daily.     Cholecalciferol (VITAMIN D3 MAXIMUM STRENGTH) 125 MCG (5000 UT) capsule Take 5,000 Units by mouth daily.     gabapentin (NEURONTIN) 600 MG tablet Take 600 mg by mouth 3 (three) times daily.     metoprolol  tartrate (LOPRESSOR ) 25 MG tablet Take 25 mg by mouth 2 (two) times daily.     Milk Thistle 1000 MG CAPS Take 1,000 mg by mouth daily.     Multiple Vitamin (MULTIVITAMIN WITH MINERALS) TABS tablet Take 1 tablet by mouth daily.     oxyCODONE -acetaminophen  (PERCOCET) 10-325 MG tablet Take 1 tablet by mouth every 6 (six) hours as needed for pain.     phentermine 37.5 MG capsule Take 37.5 mg by mouth every morning.     valsartan-hydrochlorothiazide (DIOVAN-HCT) 160-25 MG tablet Take 1 tablet by mouth daily.     No facility-administered medications prior to visit.    Review of Systems  Constitutional:  Negative for chills, fever, malaise/fatigue and weight loss.  HENT:  Negative for congestion, sinus pain and sore throat.   Eyes: Negative.   Respiratory:  Positive for shortness of breath and wheezing. Negative for cough, hemoptysis and sputum production.   Cardiovascular:  Negative for chest pain, palpitations, orthopnea, claudication and leg swelling.  Gastrointestinal:  Positive for heartburn. Negative for abdominal pain, nausea and vomiting.  Genitourinary: Negative.   Musculoskeletal:  Negative for joint pain and myalgias.  Skin:  Negative for rash.  Neurological:  Negative for weakness.  Endo/Heme/Allergies: Negative.   Psychiatric/Behavioral: Negative.      Objective:   There were no vitals filed for this visit.  Physical Exam Constitutional:      General: She is not in acute distress.    Appearance: Normal appearance.  Eyes:     General: No scleral icterus.     Conjunctiva/sclera: Conjunctivae normal.  Cardiovascular:     Rate and Rhythm: Normal rate and regular rhythm.  Pulmonary:     Breath sounds: No wheezing, rhonchi or rales.  Musculoskeletal:     Right lower leg: No edema.     Left lower leg: No edema.  Skin:    General: Skin is warm and dry.  Neurological:     General: No focal deficit present.     CBC    Component Value Date/Time   WBC 6.3 01/27/2023  0837   RBC 4.85 01/27/2023 0837   HGB 14.5 01/27/2023 0837   HCT 39.5 01/27/2023 0837   PLT 278 01/27/2023 0837   MCV 81.4 01/27/2023 0837   MCH 29.9 01/27/2023 0837   MCHC 36.7 (H) 01/27/2023 0837   RDW 12.5 01/27/2023 0837   LYMPHSABS 2.1 01/27/2023 0837   MONOABS 0.5 01/27/2023 0837   EOSABS 0.1 01/27/2023 0837   BASOSABS 0.1 01/27/2023 0837     Chest imaging:  PFT:     No data to display          Labs:  Path:  Echo:  Heart Catheterization:       Assessment & Plan:   No diagnosis found.  Discussion: Kaitlyn Ramsey is a 68 year old woman, former smoker with history of hypertension  and left breast cancer s/p lumpectomy/radiation therapy who is referred to pulmonary clinic for shortness of breath.   Wheezing and shortness of breath Recent wheezing and dyspnea, possibly due to environmental allergies or past radiation. Symptoms improving. Differential includes COPD or other lung conditions. - Order chest x-ray to evaluate lung changes post-radiation. - Prescribe albuterol  inhaler, 1-2 puffs every 4-6 hours as needed. - Schedule pulmonary function tests to evaluate for COPD or other lung conditions.  Acid reflux Chronic acid reflux exacerbated by dietary choices, potentially affecting respiratory symptoms. - Advise dietary modifications to reduce fried and spicy food intake.  Follow up in three months to review PFT results and assess symptom progression.  Dorn Chill, MD [facility] Office: [phone]   Current  Outpatient Medications:    albuterol  (VENTOLIN  HFA) 108 (90 Base) MCG/ACT inhaler, Inhale 2 puffs into the lungs every 6 (six) hours as needed for wheezing or shortness of breath., Disp: 8 g, Rfl: 6   amLODipine (NORVASC) 10 MG tablet, Take 10 mg by mouth at bedtime., Disp: , Rfl:    anastrozole  (ARIMIDEX ) 1 MG tablet, Take 1 tablet (1 mg total) by mouth daily., Disp: 90 tablet, Rfl: 3   atorvastatin (LIPITOR) 20 MG tablet, Take 20 mg by mouth daily., Disp: , Rfl:    Biotin 89999 MCG TABS, Take 10,000 mcg by mouth daily., Disp: , Rfl:    Calcium Citrate-Vitamin D (CALCIUM + D PO), Take 1 tablet by mouth daily., Disp: , Rfl:    Cholecalciferol (VITAMIN D3 MAXIMUM STRENGTH) 125 MCG (5000 UT) capsule, Take 5,000 Units by mouth daily., Disp: , Rfl:    gabapentin (NEURONTIN) 600 MG tablet, Take 600 mg by mouth 3 (three) times daily., Disp: , Rfl:    metoprolol  tartrate (LOPRESSOR ) 25 MG tablet, Take 25 mg by mouth 2 (two) times daily., Disp: , Rfl:    Milk Thistle 1000 MG CAPS, Take 1,000 mg by mouth daily., Disp: , Rfl:    Multiple Vitamin (MULTIVITAMIN WITH MINERALS) TABS tablet, Take 1 tablet by mouth daily., Disp: , Rfl:    oxyCODONE -acetaminophen  (PERCOCET) 10-325 MG tablet, Take 1 tablet by mouth every 6 (six) hours as needed for pain., Disp: , Rfl:    phentermine 37.5 MG capsule, Take 37.5 mg by mouth every morning., Disp: , Rfl:    valsartan-hydrochlorothiazide (DIOVAN-HCT) 160-25 MG tablet, Take 1 tablet by mouth daily., Disp: , Rfl:

## 2024-05-11 ENCOUNTER — Other Ambulatory Visit

## 2024-05-15 ENCOUNTER — Other Ambulatory Visit

## 2024-05-17 ENCOUNTER — Inpatient Hospital Stay: Admitting: Hematology and Oncology

## 2024-05-26 ENCOUNTER — Other Ambulatory Visit

## 2024-05-31 ENCOUNTER — Ambulatory Visit: Attending: Medical

## 2024-05-31 DIAGNOSIS — R0609 Other forms of dyspnea: Secondary | ICD-10-CM

## 2024-05-31 DIAGNOSIS — R079 Chest pain, unspecified: Secondary | ICD-10-CM

## 2024-05-31 LAB — ECHOCARDIOGRAM COMPLETE
AV Mean grad: 4 mmHg
AV Peak grad: 6.7 mmHg
Ao pk vel: 1.29 m/s
Area-P 1/2: 3.83 cm2
S' Lateral: 3.16 cm

## 2024-06-01 ENCOUNTER — Inpatient Hospital Stay: Attending: Adult Health | Admitting: Hematology and Oncology

## 2024-06-01 ENCOUNTER — Ambulatory Visit: Payer: Self-pay | Admitting: Medical

## 2024-06-01 VITALS — BP 144/56 | HR 78 | Temp 98.5°F | Resp 17 | Wt 219.6 lb

## 2024-06-01 DIAGNOSIS — D0512 Intraductal carcinoma in situ of left breast: Secondary | ICD-10-CM | POA: Diagnosis not present

## 2024-06-01 DIAGNOSIS — Z79811 Long term (current) use of aromatase inhibitors: Secondary | ICD-10-CM | POA: Diagnosis not present

## 2024-06-01 DIAGNOSIS — Z923 Personal history of irradiation: Secondary | ICD-10-CM | POA: Diagnosis not present

## 2024-06-01 NOTE — Progress Notes (Signed)
 BRIEF ONCOLOGIC HISTORY:  Oncology History  Ductal carcinoma in situ (DCIS) of left breast  12/30/2022 Mammogram   Bilateral screening mammogram showed a possible asymmetry in the left breast.  Diagnostic mammogram showed suspicious 1 cm mass in the left breast at 2:00 2 cm from the nipple, lymph node with borderline cortical thickening in the left axilla   01/15/2023 Pathology Results   Left breast needle core biopsy at 2:00 showed DCIS, intermediate nuclear grade, cribriform type with necrosis, negative for invasive carcinoma.  Lymph node was benign and reactive.  Prognostic showed ER positive at 100% strong staining PR 95% positive strong staining   02/17/2023 Surgery   Left lumpectomy: DCIS, 1.9 cm, grade 2, no necrosis, margins negative   03/31/2023 - 04/29/2023 Radiation Therapy   Site/dose:   1) Left breast 40.05 Gy delivered in 15 Fx at 2.67 Gy/Fx 2) Left breast boost - 12 Gy delivered in 6 Fx at 2.00 Gy/Fx   07/2023 -  Anti-estrogen oral therapy   Anastrozole      INTERVAL HISTORY:   Discussed the use of AI scribe software for clinical note transcription with the patient, who gave verbal consent to proceed.  History of Present Illness Kaitlyn Ramsey is a 68 year old female with personal history of breast cancer who presents with anxiety and stress related to caregiving responsibilities for her mother.  She experiences significant anxiety and stress due to caregiving responsibilities for her 73 year old mother, who has longstanding behavioral issues. Her mother lives with her and her daughter, requiring assistance with daily activities. She feels overwhelmed and tired, as her mother is often critical and demanding, leading to tension in her relationship. Despite hiring a caregiver to assist a few times a week, she feels the burden of care is primarily on her.  She has a history of breast cancer and is currently taking her anastrozole . She has completed a mammogram and  ultrasound and is awaiting a bone density test scheduled for October 14th. She experiences constipation, which she attributes to her medication, and manages it with Senna. She also takes vitamin D regularly. She notes some swelling in her legs, which is improving.  Breast: Denies any new nodularity, masses, tenderness, nipple changes, or nipple discharge.   Rest of the pertinent 10 point ROS reviewed and neg.    PAST MEDICAL/SURGICAL HISTORY:  Past Medical History:  Diagnosis Date   Allergy    [facility].   Anxiety    Environmental allergies    Gastritis    History of left breast cancer    a. 01/2023 radioactive seed localized lumpectomy and this was followed by adjuvant radiation, which was completed in late August 2024.   History of radiation therapy    Left breast-03/31/23-04/29/23- Dr. Lynwood Nasuti   History of tobacco abuse    Hyperlipidemia    Hypertension    Obesity    Osteoarthritis    Palpitations    Spondylolisthesis of lumbar region    Past Surgical History:  Procedure Laterality Date   BREAST BIOPSY Left 01/15/2023   US  LT BREAST BX W LOC DEV 1ST LESION IMG BX SPEC US  GUIDE 01/15/2023 [facility] MAMMOGRAPHY   BREAST BIOPSY  02/16/2023   MM LT RADIOACTIVE SEED LOC MAMMO GUIDE 02/16/2023 [facility] MAMMOGRAPHY   BREAST LUMPECTOMY Left 02/17/2023   BREAST LUMPECTOMY WITH RADIOACTIVE SEED LOCALIZATION Left 02/17/2023   Procedure: LEFT BREAST LUMPECTOMY WITH RADIOACTIVE SEED LOCALIZATION;  Surgeon: Belinda Cough, MD;  Location: [facility] OR;  Service: General;  Laterality: Left;  LMA   COLONOSCOPY     KNEE ARTHROSCOPY Left      ALLERGIES:  Allergies  Allergen Reactions   Penicillins Anaphylaxis and Shortness Of Breath     CURRENT MEDICATIONS:  Outpatient Encounter Medications as of 06/01/2024  Medication Sig   albuterol  (VENTOLIN  HFA) 108 (90 Base) MCG/ACT inhaler Inhale 2 puffs into the lungs every 6 (six) hours as needed for wheezing or shortness of breath.   amLODipine (NORVASC)  10 MG tablet Take 10 mg by mouth at bedtime.   anastrozole  (ARIMIDEX ) 1 MG tablet Take 1 tablet (1 mg total) by mouth daily.   atorvastatin (LIPITOR) 20 MG tablet Take 20 mg by mouth daily.   Biotin 89999 MCG TABS Take 10,000 mcg by mouth daily.   Calcium Citrate-Vitamin D (CALCIUM + D PO) Take 1 tablet by mouth daily.   Cholecalciferol (VITAMIN D3 MAXIMUM STRENGTH) 125 MCG (5000 UT) capsule Take 5,000 Units by mouth daily.   gabapentin (NEURONTIN) 600 MG tablet Take 600 mg by mouth 3 (three) times daily.   metoprolol  tartrate (LOPRESSOR ) 25 MG tablet Take 25 mg by mouth 2 (two) times daily.   Milk Thistle 1000 MG CAPS Take 1,000 mg by mouth daily.   Multiple Vitamin (MULTIVITAMIN WITH MINERALS) TABS tablet Take 1 tablet by mouth daily.   oxyCODONE -acetaminophen  (PERCOCET) 10-325 MG tablet Take 1 tablet by mouth every 6 (six) hours as needed for pain.   phentermine 37.5 MG capsule Take 37.5 mg by mouth every morning.   valsartan-hydrochlorothiazide (DIOVAN-HCT) 160-25 MG tablet Take 1 tablet by mouth daily.   No facility-administered encounter medications on file as of 06/01/2024.     ONCOLOGIC FAMILY HISTORY:  Family History  Problem Relation Age of Onset   Cancer Paternal Aunt        unknown type   Cancer Cousin        unknown type, paternal first cousin     SOCIAL HISTORY:  Social History   Socioeconomic History   Marital status: Single    Spouse name: Not on file   Number of children: Not on file   Years of education: Not on file   Highest education level: Not on file  Occupational History   Not on file  Tobacco Use   Smoking status: Former    Current packs/day: 0.00    Average packs/day: 2.0 packs/day for 18.0 years (36.0 ttl pk-yrs)    Types: Cigarettes    Start date: 8    Quit date: 4    Years since quitting: 30.7   Smokeless tobacco: Never   Tobacco comments:    I don't remember when I quit I think 1992  Vaping Use   Vaping status: Never Used   Substance and Sexual Activity   Alcohol use: Never   Drug use: Never   Sexual activity: Never  Other Topics Concern   Not on file  Social History Narrative   Not on file   Social Drivers of Health   Financial Resource Strain: Not on file  Food Insecurity: Food Insecurity Present (03/17/2023)   Hunger Vital Sign    Worried About Running Out of Food in the Last Year: Sometimes true    Ran Out of Food in the Last Year: Sometimes true  Transportation Needs: Unknown (03/17/2023)   PRAPARE - Administrator, Civil Service (Medical): Not on file    Lack of Transportation (Non-Medical): No  Physical Activity: Not on file  Stress: Not on file  Social  Connections: Unknown (01/13/2022)   Received from [facility]   Social Network    Social Network: Not on file  Intimate Partner Violence: Not At Risk (03/17/2023)   Humiliation, Afraid, Rape, and Kick questionnaire    Fear of Current or Ex-Partner: No    Emotionally Abused: No    Physically Abused: No    Sexually Abused: No     OBSERVATIONS/OBJECTIVE:  BP (!) 144/56 (BP Location: Right Arm, Patient Position: Sitting)   Pulse 78   Temp 98.5 F (36.9 C) (Temporal)   Resp 17   Wt 219 lb 9.6 oz (99.6 kg)   SpO2 97%   BMI (P) 34.91 kg/m  GENERAL: Patient is a well appearing female in no acute distress HEENT:  Sclerae anicteric.  Oropharynx clear and moist. No ulcerations or evidence of oropharyngeal candidiasis. Neck is supple.  NODES:  No cervical, supraclavicular, or axillary lymphadenopathy palpated.  BREAST EXAM:  left breast s/p lumpectomy and radiation, no sign of local recurrence, right breast benign.   LUNGS:  Clear to auscultation bilaterally.  No wheezes or rhonchi. HEART:  Regular rate and rhythm. No murmur appreciated. ABDOMEN:  Soft, nontender.  Positive, normoactive bowel sounds. No organomegaly palpated. MSK:  No focal spinal tenderness to palpation. Full range of motion bilaterally in the upper  extremities. EXTREMITIES:  No peripheral edema.   SKIN:  Clear with no obvious rashes or skin changes. No nail dyscrasia. NEURO:  Nonfocal. Well oriented.  Appropriate affect.   LABORATORY DATA:  None for this visit.  DIAGNOSTIC IMAGING:  None for this visit.      ASSESSMENT AND PLAN:  Ms.. Funk is a pleasant 68 y.o. female with Stage 0 left breast DCIS, ER+/PR+, diagnosed in 12/2022, treated with lumpectomy, adjuvant radiation therapy, and anti-estrogen therapy with Anastrozole  beginning in 07/2023.    Assessment and Plan Assessment & Plan Breast cancer, post-radiation, on adj anastrozole  No concern for breast cancer recurrence. Continue annual mammogram.  Constipation, likely medication-induced - Advise taking Senna every other day to maintain regular bowel habits. - Ensure adequate hydration.  Caregiver stress Significant stress from caregiving for 55 year old mother with possible dementia. Discussed respite care and dementia medications. Future memory care facility placement may be necessary. - Discuss respite care options with mother's doctor. - Inquire about medications for dementia with mother's doctor.   The patient was provided an opportunity to ask questions and all were answered. The patient agreed with the plan and demonstrated an understanding of the instructions.   Total encounter time:30 minutes*in face-to-face visit time, chart review, lab review, care coordination, order entry, and documentation of the encounter time.    *Total Encounter Time as defined by the Centers for Medicare and Medicaid Services includes, in addition to the face-to-face time of a patient visit (documented in the note above) non-face-to-face time: obtaining and reviewing outside history, ordering and reviewing medications, tests or procedures, care coordination (communications with other health care professionals or caregivers) and documentation in the medical record.

## 2024-06-13 ENCOUNTER — Other Ambulatory Visit

## 2024-06-17 ENCOUNTER — Other Ambulatory Visit: Payer: Self-pay | Admitting: Hematology and Oncology

## 2024-06-19 NOTE — Progress Notes (Deleted)
 NO SHOW

## 2024-06-20 ENCOUNTER — Ambulatory Visit: Attending: Cardiovascular Disease | Admitting: Cardiovascular Disease

## 2024-06-20 DIAGNOSIS — R0609 Other forms of dyspnea: Secondary | ICD-10-CM

## 2024-06-20 DIAGNOSIS — E782 Mixed hyperlipidemia: Secondary | ICD-10-CM

## 2024-06-20 DIAGNOSIS — R079 Chest pain, unspecified: Secondary | ICD-10-CM

## 2024-06-20 DIAGNOSIS — R002 Palpitations: Secondary | ICD-10-CM

## 2024-06-20 DIAGNOSIS — I10 Essential (primary) hypertension: Secondary | ICD-10-CM

## 2024-06-23 ENCOUNTER — Encounter: Payer: Self-pay | Admitting: Cardiovascular Disease

## 2024-07-11 ENCOUNTER — Ambulatory Visit: Admitting: Pulmonary Disease

## 2024-07-20 ENCOUNTER — Encounter

## 2024-07-20 ENCOUNTER — Ambulatory Visit: Admitting: Pulmonary Disease

## 2024-08-05 DIAGNOSIS — R079 Chest pain, unspecified: Secondary | ICD-10-CM | POA: Insufficient documentation

## 2024-08-05 NOTE — Progress Notes (Deleted)
 thing portable compared cardiology Office Note  Date:  08/05/2024   ID:  KASSEY LAFOREST, DOB 17-Jun-1956, MRN [id number]  PCP:  Benjamine Aland, MD   No chief complaint on file.   HPI:  Kaitlyn Ramsey is a 68 y.o. female with PMH of: former smoker,  anxiety,  hypertension,  HLD,  borderline diabetes,  left breast cancer s/p lumpectomy/radiation therapy who presents for f/u of her chest pain and SOB.   quit smoking in 1995. follows with pulmonology.    She reports chest tightness that radiates into the jaw. This started when mother moved in last year in March. It occurred 6 times in the last year, but seems to be more frequent. She feels anxious and then feels chest tightness. It lasts for 10 minutes. Breathing deep helps calm the pain. She denies nausea or vomiting. She denies lower leg edema.  She used to do a lot of walking in her job, but is now retired. When she is not anxious she does notice DOE, but no chest pain. Currently not being treated for anxiety.    She reports she was admitted in her 30s for jaw pain in [place] . She said heart enzymes were elevated. Says she had a normal echo and heart cath.    No family history of heart disease. No alcohol or drug use.   Echo 10/25  1. Left ventricular ejection fraction, by estimation, is 55 to 60%. The  left ventricle has normal function. The left ventricle has no regional  wall motion abnormalities. There is mild left ventricular hypertrophy.  Left ventricular diastolic parameters  are consistent with Grade I diastolic dysfunction (impaired relaxation).   2. Right ventricular systolic function is normal. The right ventricular  size is normal.   3. The mitral valve is normal in structure. No evidence of mitral valve  regurgitation.   4. The aortic valve is tricuspid. Aortic valve regurgitation is not  visualized.   5. The inferior vena cava is normal in size with greater than 50%  respiratory variability, suggesting  right atrial pressure of 3 mmHg.   PMH:   has a past medical history of Allergy, Anxiety, Environmental allergies, Gastritis, History of left breast cancer, History of radiation therapy, History of tobacco abuse, Hyperlipidemia, Hypertension, Obesity, Osteoarthritis, Palpitations, and Spondylolisthesis of lumbar region.   PSH:    Past Surgical History:  Procedure Laterality Date   BREAST BIOPSY Left 01/15/2023   US  LT BREAST BX W LOC DEV 1ST LESION IMG BX SPEC US  GUIDE 01/15/2023 [facility]   BREAST BIOPSY  02/16/2023   MM LT RADIOACTIVE SEED LOC MAMMO GUIDE 02/16/2023 [facility]   BREAST LUMPECTOMY Left 02/17/2023   BREAST LUMPECTOMY WITH RADIOACTIVE SEED LOCALIZATION Left 02/17/2023   Procedure: LEFT BREAST LUMPECTOMY WITH RADIOACTIVE SEED LOCALIZATION;  Surgeon: Belinda Cough, MD;  Location: [facility] OR;  Service: General;  Laterality: Left;  LMA   COLONOSCOPY     KNEE ARTHROSCOPY Left     Current Outpatient Medications  Medication Sig Dispense Refill   albuterol  (VENTOLIN  HFA) 108 (90 Base) MCG/ACT inhaler Inhale 2 puffs into the lungs every 6 (six) hours as needed for wheezing or shortness of breath. 8 g 6   amLODipine (NORVASC) 10 MG tablet Take 10 mg by mouth at bedtime.     anastrozole  (ARIMIDEX ) 1 MG tablet TAKE 1 TABLET BY MOUTH EVERY DAY 90 tablet 3   atorvastatin (LIPITOR) 20 MG tablet Take 20 mg by mouth daily.  Biotin 89999 MCG TABS Take 10,000 mcg by mouth daily.     Calcium Citrate-Vitamin D (CALCIUM + D PO) Take 1 tablet by mouth daily.     Cholecalciferol (VITAMIN D3 MAXIMUM STRENGTH) 125 MCG (5000 UT) capsule Take 5,000 Units by mouth daily.     gabapentin (NEURONTIN) 600 MG tablet Take 600 mg by mouth 3 (three) times daily.     metoprolol  tartrate (LOPRESSOR ) 25 MG tablet Take 25 mg by mouth 2 (two) times daily.     Milk Thistle 1000 MG CAPS Take 1,000 mg by mouth daily.     Multiple Vitamin (MULTIVITAMIN WITH MINERALS) TABS tablet Take 1 tablet by mouth  daily.     oxyCODONE -acetaminophen  (PERCOCET) 10-325 MG tablet Take 1 tablet by mouth every 6 (six) hours as needed for pain.     phentermine 37.5 MG capsule Take 37.5 mg by mouth every morning.     valsartan-hydrochlorothiazide (DIOVAN-HCT) 160-25 MG tablet Take 1 tablet by mouth daily.     No current facility-administered medications for this visit.     Allergies:   Penicillins   Social History:  The patient  reports that she quit smoking about 30 years ago. Her smoking use included cigarettes. She started smoking about 48 years ago. She has a 36 pack-year smoking history. She has never used smokeless tobacco. She reports that she does not drink alcohol and does not use drugs.   Family History:   family history includes Cancer in her cousin and paternal aunt.    Review of Systems: ROS   PHYSICAL EXAM: VS:  There were no vitals taken for this visit. , BMI There is no height or weight on file to calculate BMI. GEN: Well nourished, well developed, in no acute distress HEENT: normal Neck: no JVD, carotid bruits, or masses Cardiac: RRR; no murmurs, rubs, or gallops,no edema  Respiratory:  clear to auscultation bilaterally, normal work of breathing GI: soft, nontender, nondistended, + BS MS: no deformity or atrophy Skin: warm and dry, no rash Neuro:  Strength and sensation are intact Psych: euthymic mood, full affect    Recent Labs: No results found for requested labs within last 365 days.    Lipid Panel No results found for: CHOL, HDL, LDLCALC, TRIG    Wt Readings from Last 3 Encounters:  06/01/24 219 lb 9.6 oz (99.6 kg)  04/20/24 (P) 218 lb 6 oz (99.1 kg)  04/17/24 213 lb (96.6 kg)       ASSESSMENT AND PLAN:  Problem List Items Addressed This Visit   None    Disposition:   F/U  12 months   Total encounter time more than 30 minutes  Greater than 50% was spent in counseling and coordination of care with the patient    Signed, Velinda Lunger, M.D.,  Ph.D. [facility] [place], [place] [phone]

## 2024-08-07 ENCOUNTER — Ambulatory Visit: Admitting: Cardiovascular Disease

## 2024-08-07 DIAGNOSIS — R0609 Other forms of dyspnea: Secondary | ICD-10-CM

## 2024-08-07 DIAGNOSIS — E782 Mixed hyperlipidemia: Secondary | ICD-10-CM

## 2024-08-07 DIAGNOSIS — R079 Chest pain, unspecified: Secondary | ICD-10-CM

## 2024-08-07 DIAGNOSIS — I10 Essential (primary) hypertension: Secondary | ICD-10-CM

## 2024-08-07 DIAGNOSIS — R002 Palpitations: Secondary | ICD-10-CM

## 2024-09-11 ENCOUNTER — Telehealth: Payer: Self-pay | Admitting: *Deleted

## 2024-09-11 ENCOUNTER — Other Ambulatory Visit

## 2024-09-11 NOTE — Telephone Encounter (Signed)
 Ms. Spayd called stating she had to cancel bone density on 09/11/24 and needs to r/s. She requested number for [facility]. Phone # for appts provided [phone] to patient.

## 2024-09-21 ENCOUNTER — Encounter

## 2024-09-21 ENCOUNTER — Ambulatory Visit: Admitting: Pulmonary Disease

## 2024-09-26 ENCOUNTER — Ambulatory Visit: Admitting: Cardiovascular Disease

## 2024-09-29 ENCOUNTER — Ambulatory Visit: Admitting: Cardiovascular Disease

## 2024-11-29 ENCOUNTER — Encounter

## 2024-11-29 ENCOUNTER — Ambulatory Visit: Admitting: Pulmonary Disease

## 2024-12-01 ENCOUNTER — Ambulatory Visit: Admitting: Hematology and Oncology

## 2024-12-04 ENCOUNTER — Ambulatory Visit: Admitting: Cardiovascular Disease
